# Patient Record
Sex: Male | Born: 1955 | Race: White | Hispanic: No | Marital: Married | Smoking: Former smoker
Health system: Southern US, Community
[De-identification: ages and names within clinical notes are randomized; demographics above are authoritative.]

## PROBLEM LIST (undated history)

## (undated) DIAGNOSIS — R569 Unspecified convulsions: Secondary | ICD-10-CM

## (undated) DIAGNOSIS — F330 Major depressive disorder, recurrent, mild: Secondary | ICD-10-CM

## (undated) DIAGNOSIS — E538 Deficiency of other specified B group vitamins: Secondary | ICD-10-CM

## (undated) DIAGNOSIS — F5101 Primary insomnia: Secondary | ICD-10-CM

## (undated) DIAGNOSIS — E291 Testicular hypofunction: Secondary | ICD-10-CM

## (undated) DIAGNOSIS — Z794 Long term (current) use of insulin: Secondary | ICD-10-CM

## (undated) DIAGNOSIS — N400 Enlarged prostate without lower urinary tract symptoms: Secondary | ICD-10-CM

## (undated) DIAGNOSIS — F1010 Alcohol abuse, uncomplicated: Secondary | ICD-10-CM

## (undated) DIAGNOSIS — E559 Vitamin D deficiency, unspecified: Secondary | ICD-10-CM

## (undated) DIAGNOSIS — I10 Essential (primary) hypertension: Secondary | ICD-10-CM

## (undated) DIAGNOSIS — Z87442 Personal history of urinary calculi: Secondary | ICD-10-CM

## (undated) DIAGNOSIS — R609 Edema, unspecified: Secondary | ICD-10-CM

## (undated) DIAGNOSIS — E782 Mixed hyperlipidemia: Secondary | ICD-10-CM

## (undated) DIAGNOSIS — U071 COVID-19: Secondary | ICD-10-CM

## (undated) DIAGNOSIS — J189 Pneumonia, unspecified organism: Secondary | ICD-10-CM

## (undated) DIAGNOSIS — R52 Pain, unspecified: Secondary | ICD-10-CM

## (undated) DIAGNOSIS — E1142 Type 2 diabetes mellitus with diabetic polyneuropathy: Secondary | ICD-10-CM

## (undated) DIAGNOSIS — F419 Anxiety disorder, unspecified: Secondary | ICD-10-CM

## (undated) DIAGNOSIS — D649 Anemia, unspecified: Secondary | ICD-10-CM

## (undated) DIAGNOSIS — K7460 Unspecified cirrhosis of liver: Secondary | ICD-10-CM

## (undated) DIAGNOSIS — M5116 Intervertebral disc disorders with radiculopathy, lumbar region: Secondary | ICD-10-CM

## (undated) HISTORY — PX: APPENDECTOMY: SHX54

## (undated) HISTORY — DX: Anxiety disorder, unspecified: F41.9

## (undated) HISTORY — DX: Testicular hypofunction: E29.1

## (undated) HISTORY — DX: Type 2 diabetes mellitus with diabetic polyneuropathy: E11.42

## (undated) HISTORY — DX: Pain, unspecified: R52

## (undated) HISTORY — DX: Alcohol abuse, uncomplicated: F10.10

## (undated) HISTORY — DX: Vitamin D deficiency, unspecified: E55.9

## (undated) HISTORY — DX: Deficiency of other specified B group vitamins: E53.8

## (undated) HISTORY — DX: Edema, unspecified: R60.9

## (undated) HISTORY — DX: Intervertebral disc disorders with radiculopathy, lumbar region: M51.16

## (undated) HISTORY — DX: Essential (primary) hypertension: I10

## (undated) HISTORY — DX: Primary insomnia: F51.01

## (undated) HISTORY — DX: Mixed hyperlipidemia: E78.2

## (undated) HISTORY — DX: Benign prostatic hyperplasia without lower urinary tract symptoms: N40.0

## (undated) HISTORY — DX: Major depressive disorder, recurrent, mild: F33.0

## (undated) HISTORY — DX: Unspecified cirrhosis of liver: K74.60

## (undated) HISTORY — PX: BACK SURGERY: SHX140

## (undated) HISTORY — PX: ANKLE FRACTURE SURGERY: SHX122

## (undated) HISTORY — DX: Type 2 diabetes mellitus with diabetic polyneuropathy: Z79.4

---

## 2012-05-07 HISTORY — PX: COLONOSCOPY: SHX174

## 2015-10-14 DIAGNOSIS — E291 Testicular hypofunction: Secondary | ICD-10-CM | POA: Diagnosis not present

## 2015-10-20 DIAGNOSIS — M545 Low back pain: Secondary | ICD-10-CM | POA: Diagnosis not present

## 2015-10-24 DIAGNOSIS — D3131 Benign neoplasm of right choroid: Secondary | ICD-10-CM | POA: Diagnosis not present

## 2015-11-02 DIAGNOSIS — E291 Testicular hypofunction: Secondary | ICD-10-CM | POA: Diagnosis not present

## 2015-11-08 DIAGNOSIS — G5603 Carpal tunnel syndrome, bilateral upper limbs: Secondary | ICD-10-CM | POA: Diagnosis not present

## 2015-11-08 DIAGNOSIS — M545 Low back pain: Secondary | ICD-10-CM | POA: Diagnosis not present

## 2015-11-08 DIAGNOSIS — F419 Anxiety disorder, unspecified: Secondary | ICD-10-CM | POA: Insufficient documentation

## 2015-11-08 DIAGNOSIS — G8929 Other chronic pain: Secondary | ICD-10-CM | POA: Diagnosis not present

## 2015-11-10 DIAGNOSIS — F410 Panic disorder [episodic paroxysmal anxiety] without agoraphobia: Secondary | ICD-10-CM | POA: Diagnosis not present

## 2015-11-10 DIAGNOSIS — E1149 Type 2 diabetes mellitus with other diabetic neurological complication: Secondary | ICD-10-CM | POA: Diagnosis not present

## 2015-11-10 DIAGNOSIS — I10 Essential (primary) hypertension: Secondary | ICD-10-CM | POA: Diagnosis not present

## 2015-11-10 DIAGNOSIS — R5383 Other fatigue: Secondary | ICD-10-CM | POA: Diagnosis not present

## 2015-11-10 DIAGNOSIS — G9341 Metabolic encephalopathy: Secondary | ICD-10-CM | POA: Diagnosis not present

## 2015-11-10 DIAGNOSIS — E291 Testicular hypofunction: Secondary | ICD-10-CM | POA: Diagnosis not present

## 2015-11-10 DIAGNOSIS — N400 Enlarged prostate without lower urinary tract symptoms: Secondary | ICD-10-CM | POA: Diagnosis not present

## 2015-11-10 DIAGNOSIS — E785 Hyperlipidemia, unspecified: Secondary | ICD-10-CM | POA: Diagnosis not present

## 2015-11-10 DIAGNOSIS — Z6832 Body mass index (BMI) 32.0-32.9, adult: Secondary | ICD-10-CM | POA: Diagnosis not present

## 2015-11-10 DIAGNOSIS — G8929 Other chronic pain: Secondary | ICD-10-CM | POA: Diagnosis not present

## 2015-11-15 DIAGNOSIS — E291 Testicular hypofunction: Secondary | ICD-10-CM | POA: Diagnosis not present

## 2015-12-09 DIAGNOSIS — E291 Testicular hypofunction: Secondary | ICD-10-CM | POA: Diagnosis not present

## 2015-12-23 DIAGNOSIS — E291 Testicular hypofunction: Secondary | ICD-10-CM | POA: Diagnosis not present

## 2015-12-27 DIAGNOSIS — J111 Influenza due to unidentified influenza virus with other respiratory manifestations: Secondary | ICD-10-CM | POA: Diagnosis not present

## 2016-01-06 DIAGNOSIS — Z23 Encounter for immunization: Secondary | ICD-10-CM | POA: Diagnosis not present

## 2016-01-06 DIAGNOSIS — E291 Testicular hypofunction: Secondary | ICD-10-CM | POA: Diagnosis not present

## 2016-01-25 DIAGNOSIS — E291 Testicular hypofunction: Secondary | ICD-10-CM | POA: Diagnosis not present

## 2016-02-09 DIAGNOSIS — Z6833 Body mass index (BMI) 33.0-33.9, adult: Secondary | ICD-10-CM | POA: Diagnosis not present

## 2016-02-09 DIAGNOSIS — F410 Panic disorder [episodic paroxysmal anxiety] without agoraphobia: Secondary | ICD-10-CM | POA: Diagnosis not present

## 2016-02-09 DIAGNOSIS — G8929 Other chronic pain: Secondary | ICD-10-CM | POA: Diagnosis not present

## 2016-02-09 DIAGNOSIS — I10 Essential (primary) hypertension: Secondary | ICD-10-CM | POA: Diagnosis not present

## 2016-02-09 DIAGNOSIS — R5383 Other fatigue: Secondary | ICD-10-CM | POA: Diagnosis not present

## 2016-02-09 DIAGNOSIS — N400 Enlarged prostate without lower urinary tract symptoms: Secondary | ICD-10-CM | POA: Diagnosis not present

## 2016-02-09 DIAGNOSIS — E119 Type 2 diabetes mellitus without complications: Secondary | ICD-10-CM | POA: Diagnosis not present

## 2016-02-09 DIAGNOSIS — E291 Testicular hypofunction: Secondary | ICD-10-CM | POA: Diagnosis not present

## 2016-02-09 DIAGNOSIS — E785 Hyperlipidemia, unspecified: Secondary | ICD-10-CM | POA: Diagnosis not present

## 2016-02-09 DIAGNOSIS — E1149 Type 2 diabetes mellitus with other diabetic neurological complication: Secondary | ICD-10-CM | POA: Diagnosis not present

## 2016-02-09 DIAGNOSIS — G9341 Metabolic encephalopathy: Secondary | ICD-10-CM | POA: Diagnosis not present

## 2016-02-28 DIAGNOSIS — M545 Low back pain: Secondary | ICD-10-CM | POA: Diagnosis not present

## 2016-02-28 DIAGNOSIS — E7220 Disorder of urea cycle metabolism, unspecified: Secondary | ICD-10-CM | POA: Diagnosis not present

## 2016-02-28 DIAGNOSIS — R404 Transient alteration of awareness: Secondary | ICD-10-CM | POA: Diagnosis not present

## 2016-02-28 DIAGNOSIS — M5116 Intervertebral disc disorders with radiculopathy, lumbar region: Secondary | ICD-10-CM | POA: Diagnosis not present

## 2016-02-28 DIAGNOSIS — F419 Anxiety disorder, unspecified: Secondary | ICD-10-CM | POA: Diagnosis not present

## 2016-02-28 DIAGNOSIS — G8929 Other chronic pain: Secondary | ICD-10-CM | POA: Diagnosis not present

## 2016-02-28 DIAGNOSIS — F1190 Opioid use, unspecified, uncomplicated: Secondary | ICD-10-CM | POA: Diagnosis not present

## 2016-03-01 DIAGNOSIS — E291 Testicular hypofunction: Secondary | ICD-10-CM | POA: Diagnosis not present

## 2016-03-20 DIAGNOSIS — E291 Testicular hypofunction: Secondary | ICD-10-CM | POA: Diagnosis not present

## 2016-04-19 DIAGNOSIS — E291 Testicular hypofunction: Secondary | ICD-10-CM | POA: Diagnosis not present

## 2016-05-08 DIAGNOSIS — E291 Testicular hypofunction: Secondary | ICD-10-CM | POA: Diagnosis not present

## 2016-06-01 DIAGNOSIS — I10 Essential (primary) hypertension: Secondary | ICD-10-CM | POA: Diagnosis not present

## 2016-06-01 DIAGNOSIS — Z6832 Body mass index (BMI) 32.0-32.9, adult: Secondary | ICD-10-CM | POA: Diagnosis not present

## 2016-06-01 DIAGNOSIS — N400 Enlarged prostate without lower urinary tract symptoms: Secondary | ICD-10-CM | POA: Diagnosis not present

## 2016-06-01 DIAGNOSIS — F410 Panic disorder [episodic paroxysmal anxiety] without agoraphobia: Secondary | ICD-10-CM | POA: Diagnosis not present

## 2016-06-01 DIAGNOSIS — G9341 Metabolic encephalopathy: Secondary | ICD-10-CM | POA: Diagnosis not present

## 2016-06-01 DIAGNOSIS — R5383 Other fatigue: Secondary | ICD-10-CM | POA: Diagnosis not present

## 2016-06-01 DIAGNOSIS — G8929 Other chronic pain: Secondary | ICD-10-CM | POA: Diagnosis not present

## 2016-06-01 DIAGNOSIS — E1149 Type 2 diabetes mellitus with other diabetic neurological complication: Secondary | ICD-10-CM | POA: Diagnosis not present

## 2016-06-01 DIAGNOSIS — E291 Testicular hypofunction: Secondary | ICD-10-CM | POA: Diagnosis not present

## 2016-07-02 DIAGNOSIS — F419 Anxiety disorder, unspecified: Secondary | ICD-10-CM | POA: Diagnosis not present

## 2016-07-02 DIAGNOSIS — M5116 Intervertebral disc disorders with radiculopathy, lumbar region: Secondary | ICD-10-CM | POA: Diagnosis not present

## 2016-07-02 DIAGNOSIS — F1190 Opioid use, unspecified, uncomplicated: Secondary | ICD-10-CM | POA: Diagnosis not present

## 2016-07-02 DIAGNOSIS — Z79899 Other long term (current) drug therapy: Secondary | ICD-10-CM | POA: Diagnosis not present

## 2016-07-21 DIAGNOSIS — Z23 Encounter for immunization: Secondary | ICD-10-CM | POA: Diagnosis not present

## 2016-07-21 DIAGNOSIS — E291 Testicular hypofunction: Secondary | ICD-10-CM | POA: Diagnosis not present

## 2016-08-03 DIAGNOSIS — E291 Testicular hypofunction: Secondary | ICD-10-CM | POA: Diagnosis not present

## 2016-08-17 DIAGNOSIS — E291 Testicular hypofunction: Secondary | ICD-10-CM | POA: Diagnosis not present

## 2016-08-31 DIAGNOSIS — E291 Testicular hypofunction: Secondary | ICD-10-CM | POA: Diagnosis not present

## 2016-09-21 DIAGNOSIS — E291 Testicular hypofunction: Secondary | ICD-10-CM | POA: Diagnosis not present

## 2016-09-26 DIAGNOSIS — R600 Localized edema: Secondary | ICD-10-CM | POA: Diagnosis not present

## 2016-09-26 DIAGNOSIS — R609 Edema, unspecified: Secondary | ICD-10-CM | POA: Diagnosis not present

## 2016-10-23 DIAGNOSIS — I10 Essential (primary) hypertension: Secondary | ICD-10-CM | POA: Diagnosis not present

## 2016-10-23 DIAGNOSIS — F410 Panic disorder [episodic paroxysmal anxiety] without agoraphobia: Secondary | ICD-10-CM | POA: Diagnosis not present

## 2016-10-23 DIAGNOSIS — N400 Enlarged prostate without lower urinary tract symptoms: Secondary | ICD-10-CM | POA: Diagnosis not present

## 2016-10-23 DIAGNOSIS — E1149 Type 2 diabetes mellitus with other diabetic neurological complication: Secondary | ICD-10-CM | POA: Diagnosis not present

## 2016-10-23 DIAGNOSIS — E785 Hyperlipidemia, unspecified: Secondary | ICD-10-CM | POA: Diagnosis not present

## 2016-10-23 DIAGNOSIS — Z6832 Body mass index (BMI) 32.0-32.9, adult: Secondary | ICD-10-CM | POA: Diagnosis not present

## 2016-10-23 DIAGNOSIS — E291 Testicular hypofunction: Secondary | ICD-10-CM | POA: Diagnosis not present

## 2016-10-23 DIAGNOSIS — G9341 Metabolic encephalopathy: Secondary | ICD-10-CM | POA: Diagnosis not present

## 2016-10-23 DIAGNOSIS — G8929 Other chronic pain: Secondary | ICD-10-CM | POA: Diagnosis not present

## 2016-10-27 DIAGNOSIS — E291 Testicular hypofunction: Secondary | ICD-10-CM | POA: Diagnosis not present

## 2016-11-05 DIAGNOSIS — G8929 Other chronic pain: Secondary | ICD-10-CM | POA: Diagnosis not present

## 2016-11-05 DIAGNOSIS — M545 Low back pain: Secondary | ICD-10-CM | POA: Diagnosis not present

## 2016-11-05 DIAGNOSIS — Z79899 Other long term (current) drug therapy: Secondary | ICD-10-CM | POA: Diagnosis not present

## 2016-11-05 DIAGNOSIS — F1190 Opioid use, unspecified, uncomplicated: Secondary | ICD-10-CM | POA: Diagnosis not present

## 2016-11-05 DIAGNOSIS — M5116 Intervertebral disc disorders with radiculopathy, lumbar region: Secondary | ICD-10-CM | POA: Diagnosis not present

## 2016-11-10 DIAGNOSIS — E291 Testicular hypofunction: Secondary | ICD-10-CM | POA: Diagnosis not present

## 2016-12-01 DIAGNOSIS — E291 Testicular hypofunction: Secondary | ICD-10-CM | POA: Diagnosis not present

## 2016-12-06 DIAGNOSIS — D3131 Benign neoplasm of right choroid: Secondary | ICD-10-CM | POA: Diagnosis not present

## 2016-12-10 DIAGNOSIS — G9341 Metabolic encephalopathy: Secondary | ICD-10-CM | POA: Diagnosis not present

## 2016-12-10 DIAGNOSIS — R413 Other amnesia: Secondary | ICD-10-CM | POA: Diagnosis not present

## 2016-12-10 DIAGNOSIS — R609 Edema, unspecified: Secondary | ICD-10-CM | POA: Diagnosis not present

## 2016-12-10 DIAGNOSIS — G8929 Other chronic pain: Secondary | ICD-10-CM | POA: Diagnosis not present

## 2016-12-27 DIAGNOSIS — E291 Testicular hypofunction: Secondary | ICD-10-CM | POA: Diagnosis not present

## 2017-01-10 DIAGNOSIS — E291 Testicular hypofunction: Secondary | ICD-10-CM | POA: Diagnosis not present

## 2017-01-29 DIAGNOSIS — E291 Testicular hypofunction: Secondary | ICD-10-CM | POA: Diagnosis not present

## 2017-02-14 DIAGNOSIS — E291 Testicular hypofunction: Secondary | ICD-10-CM | POA: Diagnosis not present

## 2017-02-20 DIAGNOSIS — N400 Enlarged prostate without lower urinary tract symptoms: Secondary | ICD-10-CM | POA: Diagnosis not present

## 2017-02-20 DIAGNOSIS — G9341 Metabolic encephalopathy: Secondary | ICD-10-CM | POA: Diagnosis not present

## 2017-02-20 DIAGNOSIS — G8929 Other chronic pain: Secondary | ICD-10-CM | POA: Diagnosis not present

## 2017-02-20 DIAGNOSIS — E785 Hyperlipidemia, unspecified: Secondary | ICD-10-CM | POA: Diagnosis not present

## 2017-02-20 DIAGNOSIS — F410 Panic disorder [episodic paroxysmal anxiety] without agoraphobia: Secondary | ICD-10-CM | POA: Diagnosis not present

## 2017-02-20 DIAGNOSIS — I10 Essential (primary) hypertension: Secondary | ICD-10-CM | POA: Diagnosis not present

## 2017-02-20 DIAGNOSIS — R5383 Other fatigue: Secondary | ICD-10-CM | POA: Diagnosis not present

## 2017-02-20 DIAGNOSIS — E1149 Type 2 diabetes mellitus with other diabetic neurological complication: Secondary | ICD-10-CM | POA: Diagnosis not present

## 2017-03-02 DIAGNOSIS — E291 Testicular hypofunction: Secondary | ICD-10-CM | POA: Diagnosis not present

## 2017-03-05 DIAGNOSIS — G8929 Other chronic pain: Secondary | ICD-10-CM | POA: Diagnosis not present

## 2017-03-05 DIAGNOSIS — Z79899 Other long term (current) drug therapy: Secondary | ICD-10-CM | POA: Diagnosis not present

## 2017-03-05 DIAGNOSIS — M5116 Intervertebral disc disorders with radiculopathy, lumbar region: Secondary | ICD-10-CM | POA: Diagnosis not present

## 2017-03-05 DIAGNOSIS — M545 Low back pain: Secondary | ICD-10-CM | POA: Diagnosis not present

## 2017-03-12 DIAGNOSIS — M79661 Pain in right lower leg: Secondary | ICD-10-CM | POA: Diagnosis not present

## 2017-03-12 DIAGNOSIS — R2241 Localized swelling, mass and lump, right lower limb: Secondary | ICD-10-CM | POA: Diagnosis not present

## 2017-03-12 DIAGNOSIS — R600 Localized edema: Secondary | ICD-10-CM | POA: Diagnosis not present

## 2017-03-26 ENCOUNTER — Other Ambulatory Visit: Payer: Self-pay | Admitting: Nurse Practitioner

## 2017-03-26 DIAGNOSIS — IMO0002 Reserved for concepts with insufficient information to code with codable children: Secondary | ICD-10-CM

## 2017-03-26 DIAGNOSIS — R229 Localized swelling, mass and lump, unspecified: Principal | ICD-10-CM

## 2017-03-28 DIAGNOSIS — E291 Testicular hypofunction: Secondary | ICD-10-CM | POA: Diagnosis not present

## 2017-04-07 ENCOUNTER — Ambulatory Visit
Admission: RE | Admit: 2017-04-07 | Discharge: 2017-04-07 | Disposition: A | Discharge status: Home / Self Care | Payer: Self-pay | Source: Ambulatory Visit | Attending: Nurse Practitioner | Admitting: Nurse Practitioner

## 2017-04-07 DIAGNOSIS — IMO0002 Reserved for concepts with insufficient information to code with codable children: Secondary | ICD-10-CM

## 2017-04-07 DIAGNOSIS — R229 Localized swelling, mass and lump, unspecified: Principal | ICD-10-CM

## 2017-04-08 ENCOUNTER — Other Ambulatory Visit: Payer: Self-pay | Admitting: Nurse Practitioner

## 2017-04-08 DIAGNOSIS — R229 Localized swelling, mass and lump, unspecified: Principal | ICD-10-CM

## 2017-04-08 DIAGNOSIS — IMO0002 Reserved for concepts with insufficient information to code with codable children: Secondary | ICD-10-CM

## 2017-04-16 ENCOUNTER — Ambulatory Visit
Admission: RE | Admit: 2017-04-16 | Discharge: 2017-04-16 | Disposition: A | Discharge status: Home / Self Care | Payer: PPO | Source: Ambulatory Visit | Attending: Nurse Practitioner | Admitting: Nurse Practitioner

## 2017-04-16 DIAGNOSIS — IMO0002 Reserved for concepts with insufficient information to code with codable children: Secondary | ICD-10-CM

## 2017-04-16 DIAGNOSIS — R229 Localized swelling, mass and lump, unspecified: Principal | ICD-10-CM

## 2017-04-16 DIAGNOSIS — M7989 Other specified soft tissue disorders: Secondary | ICD-10-CM | POA: Diagnosis not present

## 2017-04-16 MED ORDER — GADOBENATE DIMEGLUMINE 529 MG/ML IV SOLN
20.0000 mL | Freq: Once | INTRAVENOUS | Status: AC | PRN
  Administered 2017-04-16: 20 mL via INTRAVENOUS

## 2017-04-24 DIAGNOSIS — I872 Venous insufficiency (chronic) (peripheral): Secondary | ICD-10-CM | POA: Diagnosis not present

## 2017-04-24 DIAGNOSIS — I83891 Varicose veins of right lower extremities with other complications: Secondary | ICD-10-CM | POA: Diagnosis not present

## 2017-04-24 DIAGNOSIS — I890 Lymphedema, not elsewhere classified: Secondary | ICD-10-CM | POA: Diagnosis not present

## 2017-04-24 DIAGNOSIS — M7981 Nontraumatic hematoma of soft tissue: Secondary | ICD-10-CM | POA: Diagnosis not present

## 2017-05-16 DIAGNOSIS — E291 Testicular hypofunction: Secondary | ICD-10-CM | POA: Diagnosis not present

## 2017-05-27 DIAGNOSIS — F419 Anxiety disorder, unspecified: Secondary | ICD-10-CM | POA: Diagnosis not present

## 2017-05-27 DIAGNOSIS — F1190 Opioid use, unspecified, uncomplicated: Secondary | ICD-10-CM | POA: Diagnosis not present

## 2017-05-27 DIAGNOSIS — M545 Low back pain: Secondary | ICD-10-CM | POA: Diagnosis not present

## 2017-05-27 DIAGNOSIS — M5116 Intervertebral disc disorders with radiculopathy, lumbar region: Secondary | ICD-10-CM | POA: Diagnosis not present

## 2017-06-20 DIAGNOSIS — Z0001 Encounter for general adult medical examination with abnormal findings: Secondary | ICD-10-CM | POA: Diagnosis not present

## 2017-06-20 DIAGNOSIS — E291 Testicular hypofunction: Secondary | ICD-10-CM | POA: Diagnosis not present

## 2017-06-20 DIAGNOSIS — Z6832 Body mass index (BMI) 32.0-32.9, adult: Secondary | ICD-10-CM | POA: Diagnosis not present

## 2017-06-20 DIAGNOSIS — N400 Enlarged prostate without lower urinary tract symptoms: Secondary | ICD-10-CM | POA: Diagnosis not present

## 2017-06-20 DIAGNOSIS — G9341 Metabolic encephalopathy: Secondary | ICD-10-CM | POA: Diagnosis not present

## 2017-06-20 DIAGNOSIS — F410 Panic disorder [episodic paroxysmal anxiety] without agoraphobia: Secondary | ICD-10-CM | POA: Diagnosis not present

## 2017-06-20 DIAGNOSIS — E785 Hyperlipidemia, unspecified: Secondary | ICD-10-CM | POA: Diagnosis not present

## 2017-06-20 DIAGNOSIS — Z23 Encounter for immunization: Secondary | ICD-10-CM | POA: Diagnosis not present

## 2017-06-20 DIAGNOSIS — I10 Essential (primary) hypertension: Secondary | ICD-10-CM | POA: Diagnosis not present

## 2017-06-20 DIAGNOSIS — R2241 Localized swelling, mass and lump, right lower limb: Secondary | ICD-10-CM | POA: Diagnosis not present

## 2017-06-20 DIAGNOSIS — G8929 Other chronic pain: Secondary | ICD-10-CM | POA: Diagnosis not present

## 2017-06-20 DIAGNOSIS — E1149 Type 2 diabetes mellitus with other diabetic neurological complication: Secondary | ICD-10-CM | POA: Diagnosis not present

## 2017-06-20 DIAGNOSIS — R5383 Other fatigue: Secondary | ICD-10-CM | POA: Diagnosis not present

## 2017-06-27 DIAGNOSIS — E291 Testicular hypofunction: Secondary | ICD-10-CM | POA: Diagnosis not present

## 2017-07-10 DIAGNOSIS — M5116 Intervertebral disc disorders with radiculopathy, lumbar region: Secondary | ICD-10-CM | POA: Diagnosis not present

## 2017-07-13 DIAGNOSIS — E291 Testicular hypofunction: Secondary | ICD-10-CM | POA: Diagnosis not present

## 2017-07-22 DIAGNOSIS — M79605 Pain in left leg: Secondary | ICD-10-CM | POA: Diagnosis not present

## 2017-07-22 DIAGNOSIS — Z01818 Encounter for other preprocedural examination: Secondary | ICD-10-CM | POA: Diagnosis not present

## 2017-07-22 DIAGNOSIS — S82842A Displaced bimalleolar fracture of left lower leg, initial encounter for closed fracture: Secondary | ICD-10-CM | POA: Diagnosis not present

## 2017-07-22 DIAGNOSIS — J9811 Atelectasis: Secondary | ICD-10-CM | POA: Diagnosis not present

## 2017-07-22 DIAGNOSIS — S82402A Unspecified fracture of shaft of left fibula, initial encounter for closed fracture: Secondary | ICD-10-CM | POA: Diagnosis not present

## 2017-07-30 DIAGNOSIS — I2119 ST elevation (STEMI) myocardial infarction involving other coronary artery of inferior wall: Secondary | ICD-10-CM | POA: Diagnosis not present

## 2017-07-30 DIAGNOSIS — Z01818 Encounter for other preprocedural examination: Secondary | ICD-10-CM | POA: Diagnosis not present

## 2017-07-30 DIAGNOSIS — E1142 Type 2 diabetes mellitus with diabetic polyneuropathy: Secondary | ICD-10-CM | POA: Insufficient documentation

## 2017-07-30 DIAGNOSIS — R9431 Abnormal electrocardiogram [ECG] [EKG]: Secondary | ICD-10-CM | POA: Insufficient documentation

## 2017-07-30 DIAGNOSIS — E1140 Type 2 diabetes mellitus with diabetic neuropathy, unspecified: Secondary | ICD-10-CM | POA: Diagnosis not present

## 2017-07-30 DIAGNOSIS — Z01810 Encounter for preprocedural cardiovascular examination: Secondary | ICD-10-CM | POA: Diagnosis not present

## 2017-07-31 DIAGNOSIS — E669 Obesity, unspecified: Secondary | ICD-10-CM | POA: Diagnosis not present

## 2017-07-31 DIAGNOSIS — I10 Essential (primary) hypertension: Secondary | ICD-10-CM | POA: Diagnosis not present

## 2017-07-31 DIAGNOSIS — Z7984 Long term (current) use of oral hypoglycemic drugs: Secondary | ICD-10-CM | POA: Diagnosis not present

## 2017-07-31 DIAGNOSIS — E119 Type 2 diabetes mellitus without complications: Secondary | ICD-10-CM | POA: Diagnosis not present

## 2017-07-31 DIAGNOSIS — F17210 Nicotine dependence, cigarettes, uncomplicated: Secondary | ICD-10-CM | POA: Diagnosis not present

## 2017-07-31 DIAGNOSIS — Z87891 Personal history of nicotine dependence: Secondary | ICD-10-CM | POA: Diagnosis not present

## 2017-07-31 DIAGNOSIS — G8918 Other acute postprocedural pain: Secondary | ICD-10-CM | POA: Diagnosis not present

## 2017-07-31 DIAGNOSIS — Z6832 Body mass index (BMI) 32.0-32.9, adult: Secondary | ICD-10-CM | POA: Diagnosis not present

## 2017-07-31 DIAGNOSIS — F419 Anxiety disorder, unspecified: Secondary | ICD-10-CM | POA: Diagnosis not present

## 2017-07-31 DIAGNOSIS — F17200 Nicotine dependence, unspecified, uncomplicated: Secondary | ICD-10-CM | POA: Diagnosis not present

## 2017-07-31 DIAGNOSIS — S82842A Displaced bimalleolar fracture of left lower leg, initial encounter for closed fracture: Secondary | ICD-10-CM | POA: Diagnosis not present

## 2017-07-31 DIAGNOSIS — E7800 Pure hypercholesterolemia, unspecified: Secondary | ICD-10-CM | POA: Diagnosis not present

## 2017-07-31 DIAGNOSIS — E291 Testicular hypofunction: Secondary | ICD-10-CM | POA: Diagnosis not present

## 2017-07-31 DIAGNOSIS — Z79899 Other long term (current) drug therapy: Secondary | ICD-10-CM | POA: Diagnosis not present

## 2017-07-31 DIAGNOSIS — F329 Major depressive disorder, single episode, unspecified: Secondary | ICD-10-CM | POA: Diagnosis not present

## 2017-08-13 DIAGNOSIS — S82842A Displaced bimalleolar fracture of left lower leg, initial encounter for closed fracture: Secondary | ICD-10-CM | POA: Diagnosis not present

## 2017-08-14 DIAGNOSIS — S82842A Displaced bimalleolar fracture of left lower leg, initial encounter for closed fracture: Secondary | ICD-10-CM | POA: Diagnosis not present

## 2017-08-16 DIAGNOSIS — E291 Testicular hypofunction: Secondary | ICD-10-CM | POA: Diagnosis not present

## 2017-09-03 DIAGNOSIS — Z79899 Other long term (current) drug therapy: Secondary | ICD-10-CM | POA: Diagnosis not present

## 2017-09-03 DIAGNOSIS — M5116 Intervertebral disc disorders with radiculopathy, lumbar region: Secondary | ICD-10-CM | POA: Diagnosis not present

## 2017-09-10 DIAGNOSIS — S82842A Displaced bimalleolar fracture of left lower leg, initial encounter for closed fracture: Secondary | ICD-10-CM | POA: Diagnosis not present

## 2017-09-18 DIAGNOSIS — E291 Testicular hypofunction: Secondary | ICD-10-CM | POA: Diagnosis not present

## 2017-09-18 DIAGNOSIS — R109 Unspecified abdominal pain: Secondary | ICD-10-CM | POA: Diagnosis not present

## 2017-09-18 DIAGNOSIS — R197 Diarrhea, unspecified: Secondary | ICD-10-CM | POA: Diagnosis not present

## 2017-09-19 DIAGNOSIS — R197 Diarrhea, unspecified: Secondary | ICD-10-CM | POA: Diagnosis not present

## 2017-10-02 DIAGNOSIS — E291 Testicular hypofunction: Secondary | ICD-10-CM | POA: Diagnosis not present

## 2017-10-17 DIAGNOSIS — J019 Acute sinusitis, unspecified: Secondary | ICD-10-CM | POA: Diagnosis not present

## 2017-10-17 DIAGNOSIS — E291 Testicular hypofunction: Secondary | ICD-10-CM | POA: Diagnosis not present

## 2017-10-24 DIAGNOSIS — S82842A Displaced bimalleolar fracture of left lower leg, initial encounter for closed fracture: Secondary | ICD-10-CM | POA: Diagnosis not present

## 2017-11-01 DIAGNOSIS — E291 Testicular hypofunction: Secondary | ICD-10-CM | POA: Diagnosis not present

## 2017-11-06 DIAGNOSIS — F1190 Opioid use, unspecified, uncomplicated: Secondary | ICD-10-CM | POA: Diagnosis not present

## 2017-11-06 DIAGNOSIS — M5116 Intervertebral disc disorders with radiculopathy, lumbar region: Secondary | ICD-10-CM | POA: Diagnosis not present

## 2017-11-06 DIAGNOSIS — Z79899 Other long term (current) drug therapy: Secondary | ICD-10-CM | POA: Diagnosis not present

## 2017-11-08 DIAGNOSIS — S82842A Displaced bimalleolar fracture of left lower leg, initial encounter for closed fracture: Secondary | ICD-10-CM | POA: Diagnosis not present

## 2017-12-09 DIAGNOSIS — H5203 Hypermetropia, bilateral: Secondary | ICD-10-CM | POA: Diagnosis not present

## 2017-12-09 DIAGNOSIS — H2513 Age-related nuclear cataract, bilateral: Secondary | ICD-10-CM | POA: Diagnosis not present

## 2017-12-09 DIAGNOSIS — E119 Type 2 diabetes mellitus without complications: Secondary | ICD-10-CM | POA: Diagnosis not present

## 2017-12-09 DIAGNOSIS — H50331 Intermittent monocular exotropia, right eye: Secondary | ICD-10-CM | POA: Diagnosis not present

## 2017-12-12 DIAGNOSIS — E291 Testicular hypofunction: Secondary | ICD-10-CM | POA: Diagnosis not present

## 2017-12-12 DIAGNOSIS — I10 Essential (primary) hypertension: Secondary | ICD-10-CM | POA: Diagnosis not present

## 2017-12-12 DIAGNOSIS — E119 Type 2 diabetes mellitus without complications: Secondary | ICD-10-CM | POA: Diagnosis not present

## 2017-12-12 DIAGNOSIS — E785 Hyperlipidemia, unspecified: Secondary | ICD-10-CM | POA: Diagnosis not present

## 2017-12-12 DIAGNOSIS — F419 Anxiety disorder, unspecified: Secondary | ICD-10-CM | POA: Diagnosis not present

## 2018-01-07 DIAGNOSIS — F5101 Primary insomnia: Secondary | ICD-10-CM | POA: Diagnosis not present

## 2018-01-07 DIAGNOSIS — R5381 Other malaise: Secondary | ICD-10-CM | POA: Diagnosis not present

## 2018-01-07 DIAGNOSIS — N400 Enlarged prostate without lower urinary tract symptoms: Secondary | ICD-10-CM | POA: Insufficient documentation

## 2018-01-07 DIAGNOSIS — M5116 Intervertebral disc disorders with radiculopathy, lumbar region: Secondary | ICD-10-CM | POA: Diagnosis not present

## 2018-01-07 DIAGNOSIS — E1140 Type 2 diabetes mellitus with diabetic neuropathy, unspecified: Secondary | ICD-10-CM | POA: Diagnosis not present

## 2018-01-07 DIAGNOSIS — R5383 Other fatigue: Secondary | ICD-10-CM | POA: Diagnosis not present

## 2018-01-07 DIAGNOSIS — E782 Mixed hyperlipidemia: Secondary | ICD-10-CM | POA: Insufficient documentation

## 2018-01-07 DIAGNOSIS — I10 Essential (primary) hypertension: Secondary | ICD-10-CM | POA: Insufficient documentation

## 2018-01-07 DIAGNOSIS — E538 Deficiency of other specified B group vitamins: Secondary | ICD-10-CM | POA: Diagnosis not present

## 2018-01-07 DIAGNOSIS — F419 Anxiety disorder, unspecified: Secondary | ICD-10-CM | POA: Diagnosis not present

## 2018-01-07 DIAGNOSIS — Z79899 Other long term (current) drug therapy: Secondary | ICD-10-CM | POA: Diagnosis not present

## 2018-01-24 DIAGNOSIS — R51 Headache: Secondary | ICD-10-CM | POA: Diagnosis not present

## 2018-01-24 DIAGNOSIS — I6782 Cerebral ischemia: Secondary | ICD-10-CM | POA: Diagnosis not present

## 2018-02-05 DIAGNOSIS — F410 Panic disorder [episodic paroxysmal anxiety] without agoraphobia: Secondary | ICD-10-CM | POA: Diagnosis not present

## 2018-02-05 DIAGNOSIS — G9349 Other encephalopathy: Secondary | ICD-10-CM | POA: Diagnosis not present

## 2018-02-05 DIAGNOSIS — Z79899 Other long term (current) drug therapy: Secondary | ICD-10-CM | POA: Diagnosis not present

## 2018-02-05 DIAGNOSIS — M5116 Intervertebral disc disorders with radiculopathy, lumbar region: Secondary | ICD-10-CM | POA: Diagnosis not present

## 2018-02-19 DIAGNOSIS — R7989 Other specified abnormal findings of blood chemistry: Secondary | ICD-10-CM | POA: Diagnosis not present

## 2018-03-06 DIAGNOSIS — R7989 Other specified abnormal findings of blood chemistry: Secondary | ICD-10-CM | POA: Diagnosis not present

## 2018-03-21 DIAGNOSIS — R7989 Other specified abnormal findings of blood chemistry: Secondary | ICD-10-CM | POA: Diagnosis not present

## 2018-04-08 DIAGNOSIS — R7989 Other specified abnormal findings of blood chemistry: Secondary | ICD-10-CM | POA: Diagnosis not present

## 2018-04-09 DIAGNOSIS — M5116 Intervertebral disc disorders with radiculopathy, lumbar region: Secondary | ICD-10-CM | POA: Diagnosis not present

## 2018-04-23 DIAGNOSIS — M5116 Intervertebral disc disorders with radiculopathy, lumbar region: Secondary | ICD-10-CM | POA: Diagnosis not present

## 2018-04-25 DIAGNOSIS — R7989 Other specified abnormal findings of blood chemistry: Secondary | ICD-10-CM | POA: Diagnosis not present

## 2018-05-16 DIAGNOSIS — R7989 Other specified abnormal findings of blood chemistry: Secondary | ICD-10-CM | POA: Diagnosis not present

## 2018-05-22 DIAGNOSIS — M5116 Intervertebral disc disorders with radiculopathy, lumbar region: Secondary | ICD-10-CM | POA: Diagnosis not present

## 2018-05-22 DIAGNOSIS — F1190 Opioid use, unspecified, uncomplicated: Secondary | ICD-10-CM | POA: Diagnosis not present

## 2018-05-22 DIAGNOSIS — Z79899 Other long term (current) drug therapy: Secondary | ICD-10-CM | POA: Diagnosis not present

## 2018-06-09 DIAGNOSIS — R7989 Other specified abnormal findings of blood chemistry: Secondary | ICD-10-CM | POA: Diagnosis not present

## 2018-06-23 DIAGNOSIS — Z23 Encounter for immunization: Secondary | ICD-10-CM | POA: Diagnosis not present

## 2018-07-07 DIAGNOSIS — R7989 Other specified abnormal findings of blood chemistry: Secondary | ICD-10-CM | POA: Diagnosis not present

## 2018-07-22 DIAGNOSIS — R7989 Other specified abnormal findings of blood chemistry: Secondary | ICD-10-CM | POA: Diagnosis not present

## 2018-08-08 DIAGNOSIS — R7989 Other specified abnormal findings of blood chemistry: Secondary | ICD-10-CM | POA: Diagnosis not present

## 2018-08-26 DIAGNOSIS — R7989 Other specified abnormal findings of blood chemistry: Secondary | ICD-10-CM | POA: Diagnosis not present

## 2018-09-10 DIAGNOSIS — F5101 Primary insomnia: Secondary | ICD-10-CM | POA: Diagnosis not present

## 2018-09-10 DIAGNOSIS — F419 Anxiety disorder, unspecified: Secondary | ICD-10-CM | POA: Diagnosis not present

## 2018-09-10 DIAGNOSIS — E1142 Type 2 diabetes mellitus with diabetic polyneuropathy: Secondary | ICD-10-CM | POA: Diagnosis not present

## 2018-09-10 DIAGNOSIS — E782 Mixed hyperlipidemia: Secondary | ICD-10-CM | POA: Diagnosis not present

## 2018-09-10 DIAGNOSIS — E538 Deficiency of other specified B group vitamins: Secondary | ICD-10-CM | POA: Diagnosis not present

## 2018-09-10 DIAGNOSIS — N400 Enlarged prostate without lower urinary tract symptoms: Secondary | ICD-10-CM | POA: Diagnosis not present

## 2018-09-10 DIAGNOSIS — E119 Type 2 diabetes mellitus without complications: Secondary | ICD-10-CM | POA: Diagnosis not present

## 2018-09-10 DIAGNOSIS — E291 Testicular hypofunction: Secondary | ICD-10-CM | POA: Diagnosis not present

## 2018-09-10 DIAGNOSIS — Z79899 Other long term (current) drug therapy: Secondary | ICD-10-CM | POA: Diagnosis not present

## 2018-09-10 DIAGNOSIS — I10 Essential (primary) hypertension: Secondary | ICD-10-CM | POA: Diagnosis not present

## 2018-09-10 DIAGNOSIS — Z794 Long term (current) use of insulin: Secondary | ICD-10-CM | POA: Diagnosis not present

## 2018-09-17 DIAGNOSIS — Z87891 Personal history of nicotine dependence: Secondary | ICD-10-CM | POA: Diagnosis not present

## 2018-09-17 DIAGNOSIS — J3489 Other specified disorders of nose and nasal sinuses: Secondary | ICD-10-CM | POA: Diagnosis not present

## 2018-09-17 DIAGNOSIS — R05 Cough: Secondary | ICD-10-CM | POA: Diagnosis not present

## 2018-09-17 DIAGNOSIS — J209 Acute bronchitis, unspecified: Secondary | ICD-10-CM | POA: Diagnosis not present

## 2018-10-15 DIAGNOSIS — E291 Testicular hypofunction: Secondary | ICD-10-CM | POA: Diagnosis not present

## 2018-11-17 DIAGNOSIS — S80811A Abrasion, right lower leg, initial encounter: Secondary | ICD-10-CM | POA: Diagnosis not present

## 2018-11-17 DIAGNOSIS — L602 Onychogryphosis: Secondary | ICD-10-CM | POA: Diagnosis not present

## 2018-11-17 DIAGNOSIS — Z794 Long term (current) use of insulin: Secondary | ICD-10-CM | POA: Diagnosis not present

## 2018-11-17 DIAGNOSIS — E1142 Type 2 diabetes mellitus with diabetic polyneuropathy: Secondary | ICD-10-CM | POA: Diagnosis not present

## 2019-01-19 DIAGNOSIS — E291 Testicular hypofunction: Secondary | ICD-10-CM | POA: Diagnosis not present

## 2019-01-29 DIAGNOSIS — Z794 Long term (current) use of insulin: Secondary | ICD-10-CM | POA: Diagnosis not present

## 2019-01-29 DIAGNOSIS — I10 Essential (primary) hypertension: Secondary | ICD-10-CM | POA: Diagnosis not present

## 2019-01-29 DIAGNOSIS — M792 Neuralgia and neuritis, unspecified: Secondary | ICD-10-CM | POA: Diagnosis not present

## 2019-01-29 DIAGNOSIS — Z79899 Other long term (current) drug therapy: Secondary | ICD-10-CM | POA: Diagnosis not present

## 2019-01-29 DIAGNOSIS — M5090 Cervical disc disorder, unspecified, unspecified cervical region: Secondary | ICD-10-CM | POA: Diagnosis not present

## 2019-01-29 DIAGNOSIS — E1142 Type 2 diabetes mellitus with diabetic polyneuropathy: Secondary | ICD-10-CM | POA: Diagnosis not present

## 2019-02-04 DIAGNOSIS — E291 Testicular hypofunction: Secondary | ICD-10-CM | POA: Diagnosis not present

## 2019-02-19 DIAGNOSIS — E291 Testicular hypofunction: Secondary | ICD-10-CM | POA: Diagnosis not present

## 2019-03-05 DIAGNOSIS — E291 Testicular hypofunction: Secondary | ICD-10-CM | POA: Diagnosis not present

## 2019-03-20 DIAGNOSIS — Z79899 Other long term (current) drug therapy: Secondary | ICD-10-CM | POA: Diagnosis not present

## 2019-03-20 DIAGNOSIS — E1142 Type 2 diabetes mellitus with diabetic polyneuropathy: Secondary | ICD-10-CM | POA: Diagnosis not present

## 2019-03-20 DIAGNOSIS — F410 Panic disorder [episodic paroxysmal anxiety] without agoraphobia: Secondary | ICD-10-CM | POA: Diagnosis not present

## 2019-03-20 DIAGNOSIS — Z794 Long term (current) use of insulin: Secondary | ICD-10-CM | POA: Diagnosis not present

## 2019-03-20 DIAGNOSIS — N400 Enlarged prostate without lower urinary tract symptoms: Secondary | ICD-10-CM | POA: Diagnosis not present

## 2019-03-20 DIAGNOSIS — M5116 Intervertebral disc disorders with radiculopathy, lumbar region: Secondary | ICD-10-CM | POA: Diagnosis not present

## 2019-03-20 DIAGNOSIS — F419 Anxiety disorder, unspecified: Secondary | ICD-10-CM | POA: Diagnosis not present

## 2019-03-20 DIAGNOSIS — E538 Deficiency of other specified B group vitamins: Secondary | ICD-10-CM | POA: Diagnosis not present

## 2019-03-20 DIAGNOSIS — E291 Testicular hypofunction: Secondary | ICD-10-CM | POA: Diagnosis not present

## 2019-03-20 DIAGNOSIS — E782 Mixed hyperlipidemia: Secondary | ICD-10-CM | POA: Diagnosis not present

## 2019-03-20 DIAGNOSIS — I10 Essential (primary) hypertension: Secondary | ICD-10-CM | POA: Diagnosis not present

## 2019-03-20 DIAGNOSIS — Z23 Encounter for immunization: Secondary | ICD-10-CM | POA: Diagnosis not present

## 2019-03-20 DIAGNOSIS — F330 Major depressive disorder, recurrent, mild: Secondary | ICD-10-CM | POA: Diagnosis not present

## 2019-03-20 DIAGNOSIS — Z0000 Encounter for general adult medical examination without abnormal findings: Secondary | ICD-10-CM | POA: Diagnosis not present

## 2019-03-20 DIAGNOSIS — F5101 Primary insomnia: Secondary | ICD-10-CM | POA: Diagnosis not present

## 2019-03-27 DIAGNOSIS — G40909 Epilepsy, unspecified, not intractable, without status epilepticus: Secondary | ICD-10-CM | POA: Diagnosis not present

## 2019-03-27 DIAGNOSIS — R413 Other amnesia: Secondary | ICD-10-CM | POA: Diagnosis not present

## 2019-03-27 DIAGNOSIS — Z79899 Other long term (current) drug therapy: Secondary | ICD-10-CM | POA: Diagnosis not present

## 2019-03-27 DIAGNOSIS — G4700 Insomnia, unspecified: Secondary | ICD-10-CM | POA: Diagnosis not present

## 2019-03-27 DIAGNOSIS — G5603 Carpal tunnel syndrome, bilateral upper limbs: Secondary | ICD-10-CM | POA: Diagnosis not present

## 2019-03-27 DIAGNOSIS — F1190 Opioid use, unspecified, uncomplicated: Secondary | ICD-10-CM | POA: Diagnosis not present

## 2019-03-27 DIAGNOSIS — F419 Anxiety disorder, unspecified: Secondary | ICD-10-CM | POA: Diagnosis not present

## 2019-03-27 DIAGNOSIS — R51 Headache: Secondary | ICD-10-CM | POA: Diagnosis not present

## 2019-03-27 DIAGNOSIS — F410 Panic disorder [episodic paroxysmal anxiety] without agoraphobia: Secondary | ICD-10-CM | POA: Diagnosis not present

## 2019-03-27 DIAGNOSIS — M5116 Intervertebral disc disorders with radiculopathy, lumbar region: Secondary | ICD-10-CM | POA: Diagnosis not present

## 2019-03-27 DIAGNOSIS — R269 Unspecified abnormalities of gait and mobility: Secondary | ICD-10-CM | POA: Diagnosis not present

## 2019-03-30 ENCOUNTER — Encounter: Payer: Self-pay | Admitting: Gastroenterology

## 2019-04-02 DIAGNOSIS — E291 Testicular hypofunction: Secondary | ICD-10-CM | POA: Diagnosis not present

## 2019-04-10 ENCOUNTER — Encounter: Payer: Self-pay | Admitting: Gastroenterology

## 2019-04-10 ENCOUNTER — Telehealth (INDEPENDENT_AMBULATORY_CARE_PROVIDER_SITE_OTHER): Payer: PPO | Admitting: Gastroenterology

## 2019-04-10 ENCOUNTER — Other Ambulatory Visit: Payer: Self-pay

## 2019-04-10 ENCOUNTER — Telehealth: Payer: Self-pay | Admitting: Gastroenterology

## 2019-04-10 VITALS — Ht 70.0 in | Wt 215.0 lb

## 2019-04-10 DIAGNOSIS — Z1211 Encounter for screening for malignant neoplasm of colon: Secondary | ICD-10-CM

## 2019-04-10 DIAGNOSIS — K760 Fatty (change of) liver, not elsewhere classified: Secondary | ICD-10-CM

## 2019-04-10 DIAGNOSIS — Z1212 Encounter for screening for malignant neoplasm of rectum: Secondary | ICD-10-CM

## 2019-04-10 DIAGNOSIS — R1030 Lower abdominal pain, unspecified: Secondary | ICD-10-CM

## 2019-04-10 MED ORDER — NA SULFATE-K SULFATE-MG SULF 17.5-3.13-1.6 GM/177ML PO SOLN: 1.0000 | Freq: Once | ORAL | 0 refills | Status: AC

## 2019-04-10 NOTE — Addendum Note (Signed)
Addended by: Herma Mering D on: 04/10/2019 04:41 PM   Modules accepted: Orders

## 2019-04-10 NOTE — Telephone Encounter (Signed)
Pharmacy called would like to know if it is 2 or 1 kit for the prep

## 2019-04-10 NOTE — Addendum Note (Signed)
Addended by: Herma Mering D on: 04/10/2019 11:37 AM   Modules accepted: Orders

## 2019-04-10 NOTE — Progress Notes (Signed)
Chief Complaint: for colon/liver  Referring Provider:  Dr Bea Graff      ASSESSMENT AND PLAN;   #1.  Colorectal cancer screening.  #2.  Fatty liver with Nl LFTs. Liver Bx-12/2013 Gd1/Stage 0 steatohepatitis without fibrosis. Has assoc DM2, obesity, HLD/hypertriglyceridemia.  #3.  Intermittent constipation with lower abdominal pain.  Plan; - Miralax 17g po qd. - Proceed with colonoscopy with 2 day prep. Discussed risks & benefits. (Risks including rare perforation req laparotomy, bleeding after biopsies/polypectomy req blood transfusion, rare chance of missing neoplasms, risks of anesthesia/sedation). Benefits outweigh the risks. Patient agrees to proceed. All the questions were answered.  - [place] abdo complete. - Encouraged to lose weight.  His DM/HLD is under good control. He is under excellent care with Dr. Bea Graff. - If still with problems and the above WU is negative, proceed with CT scan abdo/pelvis.    HPI:    Rodney Harper is a 63 y.o. male  No nausea, vomiting, heartburn, regurgitation, odynophagia or dysphagia.  No significant diarrhea. There is no melena or hematochezia. No unintentional weight loss.  Did have lower abdominal pain with intermittent constipation and abdominal bloating which gets better with defecation.  No history of itching, skin lesions, easy bruisability, intake of over-the-counter medications including diet pills, herbal medications, anabolic steroids or Tylenol.  There is no history of blood transfusions, IV drug use or family history of liver disease.  No jaundice dark urine or pale stools.    H/O alcohol abuse in remote past.  No alcohol intake over last 5 years.  Review of his labs-normal LFTs with AST 23, ALT 22, albumin 4.3 on 03/20/2019.  Normal platelet count.  No evidence of liver cirrhosis.  Past GI procedures: Colonoscopy 05/2012 ([facility])-poor prep, mild sigmoid diverticulosis, small internal hemorrhoids. Past Medical History:  Diagnosis Date   . Alcohol abuse - H/O   . Anxiety   . Benign essential HTN   . Benign prostatic hyperplasia without lower urinary tract symptoms   . Chronic edema       . Hypogonadism in male   . Lumbar disc herniation with radiculopathy   . Mild episode of recurrent major depressive disorder ([place])   . Mixed hyperlipidemia   . Pain disorder   . Primary insomnia   . Type 2 diabetes mellitus with diabetic polyneuropathy, with long-term current use of insulin ([place])   . Vitamin B12 deficiency   . Vitamin D deficiency     Past Surgical History:  Procedure Laterality Date  . APPENDECTOMY    . BACK SURGERY    . COLONOSCOPY  05/07/2012   Mild sigmoid diverticulosis. Small internal hemorrhoids. OTherwise normal colonoscopy.     Family History  Problem Relation Age of Onset  . Throat cancer Mother   . Heart disease Father   . Heart attack Father   . Hypertension Father   . Colon cancer Neg Hx     Social History   Tobacco Use  . Smoking status: Never Smoker  . Smokeless tobacco: Never Used  Substance Use Topics  . Alcohol use: Not Currently  . Drug use: Not on file    Current Outpatient Medications  Medication Sig Dispense Refill  . ALPRAZolam (XANAX) 0.5 MG tablet Take 0.5 mg by mouth every 8 (eight) hours as needed.    . baclofen (LIORESAL) 10 MG tablet Take 10 mg by mouth 3 (three) times daily.    . Cholecalciferol (VITAMIN D-1000 MAX ST) 25 MCG (1000 UT) tablet Take 1,000  Units by mouth daily.    Marland Kitchen escitalopram (LEXAPRO) 20 MG tablet Take 20 mg by mouth daily.    Marland Kitchen ezetimibe (ZETIA) 10 MG tablet Take 10 mg by mouth at bedtime.    . furosemide (LASIX) 20 MG tablet Take 20 mg by mouth daily.    Marland Kitchen glipiZIDE (GLUCOTROL XL) 5 MG 24 hr tablet Take 5 mg by mouth daily.    Marland Kitchen losartan-hydrochlorothiazide (HYZAAR) 50-12.5 MG tablet Take 1 tablet by mouth daily.    . metFORMIN (GLUCOPHAGE-XR) 500 MG 24 hr tablet Take 500 mg by mouth 2 (two) times a day.    . metoprolol succinate (TOPROL-XL)  100 MG 24 hr tablet Take 100 mg by mouth daily.    . mupirocin ointment (BACTROBAN) 2 % Apply topically 2 (two) times daily as needed.    Marland Kitchen oxyCODONE-acetaminophen (PERCOCET) 10-325 MG tablet Take 1 tablet by mouth every 6 (six) hours as needed.    . pregabalin (LYRICA) 75 MG capsule Take 75 mg by mouth daily.    Marland Kitchen rOPINIRole (REQUIP) 0.25 MG tablet Take 1-2 tablets by mouth at bedtime as needed.    . sildenafil (REVATIO) 20 MG tablet Take 5 tablets by mouth as directed.    . terazosin (HYTRIN) 2 MG capsule Take 2 mg by mouth at bedtime.    Marland Kitchen testosterone cypionate (DEPOTESTOSTERONE CYPIONATE) 200 MG/ML injection Inject 200 mg into the muscle every 14 (fourteen) days.    . vitamin B-12 (CYANOCOBALAMIN) 1000 MCG tablet Take 1,000 mcg by mouth daily.     No current facility-administered medications for this visit.     Allergies  Allergen Reactions  . Carbamazepine Other (See Comments)    Unknown  . Clonidine Rash    Review of Systems:  Constitutional: Denies fever, chills, diaphoresis, appetite change and fatigue.  HEENT: Denies photophobia, eye pain, redness, hearing loss, ear pain, congestion, sore throat, rhinorrhea, sneezing, mouth sores, neck pain, neck stiffness and tinnitus.   Respiratory: Denies SOB, DOE, cough, chest tightness,  and wheezing.   Cardiovascular: Denies chest pain, palpitations and leg swelling.  Genitourinary: Denies dysuria, urgency, frequency, hematuria, flank pain and difficulty urinating.  Musculoskeletal: Has myalgias, back pain, joint swelling, arthralgias and gait problem.  Skin: No rash.  Neurological: Denies dizziness, seizures, syncope, weakness, light-headedness, numbness and headaches.  Hematological: Denies adenopathy. Easy bruising, personal or family bleeding history  Psychiatric/Behavioral: Has anxiety or depression     Physical Exam:    Ht 5\' 10"  (1.778 m)   Wt 215 lb (97.5 kg)   BMI 30.85 kg/m  Filed Weights   04/10/19 0901  Weight:  215 lb (97.5 kg)   Constitutional:  Well-developed, in no acute distress. Psychiatric: Normal mood and affect. Behavior is normal.   Data Reviewed: I have personally reviewed following labs and imaging studies Labs   This service was provided via Doxy video visit.  The patient was located at home.  The provider was located in office.  The patient did consent to this telephone visit and is aware of possible charges through their insurance for this visit.  The patient was referred by Dr. Bea Graff.  The other persons participating in this telemedicine service were patient's wife and their role was assistance (she was having a rough morning with migraine headaches)  Time spent on call/review of records/coordination of care: 45 min     Carmell Austria, MD 04/10/2019, 9:27 AM  Cc: Dr Bea Graff.

## 2019-04-10 NOTE — Patient Instructions (Addendum)
If you are age 62 or older, your body mass index should be between 23-30. Your Body mass index is 30.85 kg/m. If this is out of the aforementioned range listed, please consider follow up with your Primary Care Provider.  If you are age 58 or younger, your body mass index should be between 19-25. Your Body mass index is 30.85 kg/m. If this is out of the aformentioned range listed, please consider follow up with your Primary Care Provider.   To help prevent the possible spread of infection to our patients, communities, and staff; we will be implementing the following measures:  As of now we are not allowing any visitors/family members to accompany you to any upcoming appointments with [facility]. If you have any concerns about this please contact our office to discuss prior to the appointment.   You have been scheduled for a colonoscopy. Please follow written instructions given to you at your visit today.  Please pick up your prep supplies at the pharmacy within the next 1-3 days. If you use inhalers (even only as needed), please bring them with you on the day of your procedure. Your physician has requested that you go to www.startemmi.com and enter the access code given to you at your visit today. This web site gives a general overview about your procedure. However, you should still follow specific instructions given to you by our office regarding your preparation for the procedure.  We have sent the following medications to your pharmacy for you to pick up at your convenience: Suprep  Please purchase the following medications over the counter and take as directed: Miralax 17g by mouth daily  Thank you,  Dr. Jackquline Denmark

## 2019-04-13 NOTE — Telephone Encounter (Signed)
I have called and clarified with pharmacy.

## 2019-04-16 ENCOUNTER — Ambulatory Visit (HOSPITAL_BASED_OUTPATIENT_CLINIC_OR_DEPARTMENT_OTHER)
Admission: RE | Admit: 2019-04-16 | Discharge: 2019-04-16 | Disposition: A | Discharge status: Home / Self Care | Payer: PPO | Source: Ambulatory Visit | Attending: Gastroenterology | Admitting: Gastroenterology

## 2019-04-16 ENCOUNTER — Other Ambulatory Visit: Payer: Self-pay

## 2019-04-16 DIAGNOSIS — K760 Fatty (change of) liver, not elsewhere classified: Secondary | ICD-10-CM | POA: Insufficient documentation

## 2019-04-16 DIAGNOSIS — I700 Atherosclerosis of aorta: Secondary | ICD-10-CM | POA: Diagnosis not present

## 2019-04-16 DIAGNOSIS — R1030 Lower abdominal pain, unspecified: Secondary | ICD-10-CM | POA: Diagnosis not present

## 2019-04-16 DIAGNOSIS — N200 Calculus of kidney: Secondary | ICD-10-CM | POA: Diagnosis not present

## 2019-04-16 DIAGNOSIS — E291 Testicular hypofunction: Secondary | ICD-10-CM | POA: Diagnosis not present

## 2019-04-16 DIAGNOSIS — R140 Abdominal distension (gaseous): Secondary | ICD-10-CM | POA: Diagnosis not present

## 2019-04-17 ENCOUNTER — Telehealth: Payer: Self-pay | Admitting: Gastroenterology

## 2019-04-17 NOTE — Telephone Encounter (Signed)
Please call [phone] to speak with the wife thanks

## 2019-04-17 NOTE — Telephone Encounter (Signed)
Patient wife called back in wanting to speak with the nurse. She stated that the new prep that was ordered has different instructions and she is needing to go over them

## 2019-04-17 NOTE — Telephone Encounter (Signed)
I have went over new instructions for Miralax instructions with patients wife at patients request. She voiced understanding.

## 2019-04-17 NOTE — Telephone Encounter (Signed)
Patient pharmacy called and stated that the prep was to high with the patients insurance

## 2019-04-20 ENCOUNTER — Telehealth: Payer: Self-pay | Admitting: Gastroenterology

## 2019-04-20 NOTE — Telephone Encounter (Signed)
I have called and lmom for a return phone call.

## 2019-04-20 NOTE — Telephone Encounter (Signed)

## 2019-04-20 NOTE — Telephone Encounter (Signed)
Patient would like to verify she wrote down the instructions correct for the procedure tomorrow.

## 2019-04-21 ENCOUNTER — Encounter: Payer: Self-pay | Admitting: Gastroenterology

## 2019-04-21 ENCOUNTER — Other Ambulatory Visit: Payer: Self-pay

## 2019-04-21 ENCOUNTER — Ambulatory Visit (AMBULATORY_SURGERY_CENTER): Payer: PPO | Admitting: Gastroenterology

## 2019-04-21 VITALS — BP 110/48 | HR 56 | Temp 97.4°F | Resp 13 | Ht 70.0 in | Wt 215.0 lb

## 2019-04-21 DIAGNOSIS — R1030 Lower abdominal pain, unspecified: Secondary | ICD-10-CM | POA: Diagnosis not present

## 2019-04-21 DIAGNOSIS — Z1211 Encounter for screening for malignant neoplasm of colon: Secondary | ICD-10-CM

## 2019-04-21 MED ORDER — SODIUM CHLORIDE 0.9 % IV SOLN: 500.0000 mL | Freq: Once | INTRAVENOUS | Status: DC

## 2019-04-21 NOTE — Op Note (Signed)
[facility] Patient Name: Rodney Harper Procedure Date: 04/21/2019 10:24 AM MRN: [id number] Endoscopist: Jackquline Denmark , MD Age: 63 Referring MD:  Date of Birth: 10-Mar-1956 Gender: Male Account #: [id number] Procedure:                Colonoscopy Indications:              Screening for colorectal malignant neoplasm Medicines:                Monitored Anesthesia Care Procedure:                Pre-Anesthesia Assessment:                           - Prior to the procedure, a History and Physical                            was performed, and patient medications and                            allergies were reviewed. The patient's tolerance of                            previous anesthesia was also reviewed. The risks                            and benefits of the procedure and the sedation                            options and risks were discussed with the patient.                            All questions were answered, and informed consent                            was obtained. Prior Anticoagulants: The patient has                            taken no previous anticoagulant or antiplatelet                            agents. ASA Grade Assessment: III - A patient with                            severe systemic disease. After reviewing the risks                            and benefits, the patient was deemed in                            satisfactory condition to undergo the procedure.                           After obtaining informed consent, the colonoscope  was passed under direct vision. Throughout the                            procedure, the patient's blood pressure, pulse, and                            oxygen saturations were monitored continuously. The                            Colonoscope was introduced through the anus and                            advanced to the the cecum, identified by                            appendiceal orifice and  ileocecal valve. The                            colonoscopy was performed without difficulty. The                            patient tolerated the procedure well. The quality                            of the bowel preparation was good except in some                            areas of the sigmoid colon and in the cecum where                            there was some adherent stool which could not be                            fully washed. Overall, the examination was                            adequate. The ileocecal valve, appendiceal orifice,                            and rectum were photographed. Scope In: 10:54:47 AM Scope Out: 11:11:14 AM Scope Withdrawal Time: 0 hours 9 minutes 40 seconds  Total Procedure Duration: 0 hours 16 minutes 27 seconds  Findings:                 A few small-mouthed diverticula were found in the                            sigmoid colon.                           Non-bleeding internal hemorrhoids were found during                            retroflexion. The hemorrhoids were small.  The exam was otherwise without abnormality. Complications:            No immediate complications. Estimated Blood Loss:     Estimated blood loss: none. Impression:               -Mild sigmoid diverticulosis.                           -Small internal hemorrhoids.                           -Otherwise normal colonoscopy. Recommendation:           - Patient has a contact number available for                            emergencies. The signs and symptoms of potential                            delayed complications were discussed with the                            patient. Return to normal activities tomorrow.                            Written discharge instructions were provided to the                            patient.                           - Resume previous diet.                           - Miralax 1 capful (17 grams) in 8 ounces of water                             PO daily.                           - Minimize pain medications.                           - Repeat colonoscopy in 10 years for screening                            purposes. Earlier, if with any new problems or if                            there is any change in family history.                           - Return to GI office PRN. Jackquline Denmark, MD 04/21/2019 11:18:09 AM This report has been signed electronically.

## 2019-04-21 NOTE — Patient Instructions (Signed)
Please read handouts provided. Minimize pain medications. Miralax 1 capful ( 17 grams ) in 8 ounces of water daily. Return to GI office as needed.     YOU HAD AN ENDOSCOPIC PROCEDURE TODAY AT [facility]:   Refer to the procedure report that was given to you for any specific questions about what was found during the examination.  If the procedure report does not answer your questions, please call your gastroenterologist to clarify.  If you requested that your care partner not be given the details of your procedure findings, then the procedure report has been included in a sealed envelope for you to review at your convenience later.  YOU SHOULD EXPECT: Some feelings of bloating in the abdomen. Passage of more gas than usual.  Walking can help get rid of the air that was put into your GI tract during the procedure and reduce the bloating. If you had a lower endoscopy (such as a colonoscopy or flexible sigmoidoscopy) you may notice spotting of blood in your stool or on the toilet paper. If you underwent a bowel prep for your procedure, you may not have a normal bowel movement for a few days.  Please Note:  You might notice some irritation and congestion in your nose or some drainage.  This is from the oxygen used during your procedure.  There is no need for concern and it should clear up in a day or so.  SYMPTOMS TO REPORT IMMEDIATELY:   Following lower endoscopy (colonoscopy or flexible sigmoidoscopy):  Excessive amounts of blood in the stool  Significant tenderness or worsening of abdominal pains  Swelling of the abdomen that is new, acute  Fever of 100F or higher   For urgent or emergent issues, a gastroenterologist can be reached at any hour by calling [phone].   DIET:  We do recommend a small meal at first, but then you may proceed to your regular diet.  Drink plenty of fluids but you should avoid alcoholic beverages for 24 hours.  ACTIVITY:  You should plan to  take it easy for the rest of today and you should NOT DRIVE or use heavy machinery until tomorrow (because of the sedation medicines used during the test).    FOLLOW UP: Our staff will call the number listed on your records 48-72 hours following your procedure to check on you and address any questions or concerns that you may have regarding the information given to you following your procedure. If we do not reach you, we will leave a message.  We will attempt to reach you two times.  During this call, we will ask if you have developed any symptoms of COVID 19. If you develop any symptoms (ie: fever, flu-like symptoms, shortness of breath, cough etc.) before then, please call [phone].  If you test positive for Covid 19 in the 2 weeks post procedure, please call and report this information to [place].    If any biopsies were taken you will be contacted by phone or by letter within the next 1-3 weeks.  Please call us at [phone] if you have not heard about the biopsies in 3 weeks.    SIGNATURES/CONFIDENTIALITY: You and/or your care partner have signed paperwork which will be entered into your electronic medical record.  These signatures attest to the fact that that the information above on your After Visit Summary has been reviewed and is understood.  Full responsibility of the confidentiality of this discharge information lies with you  and/or your care-partner. 

## 2019-04-21 NOTE — Progress Notes (Signed)
PT taken to PACU. Monitors in place. VSS. Report given to RN. 

## 2019-04-21 NOTE — Progress Notes (Signed)
Pt's states no medical or surgical changes since previsit or office visit.  [place] temps and Rica Mote CMA vitals.  CBG was 85, pt took Metformin this am. Started D5W IV, normally pt states the he would "be sweating and having symptoms" of hypoglycemia. Will recheck CBG in 15 min. SM

## 2019-04-23 ENCOUNTER — Telehealth: Payer: Self-pay

## 2019-04-23 NOTE — Telephone Encounter (Signed)
  Follow up Call-  Call back number 04/21/2019  Post procedure Call Back phone  # [phone]  Permission to leave phone message Yes  Some recent data might be hidden     Patient questions:  Do you have a fever, pain , or abdominal swelling? No. Pain Score  0 *  Have you tolerated food without any problems? Yes.    Have you been able to return to your normal activities? Yes.    Do you have any questions about your discharge instructions: Diet   No. Medications  No. Follow up visit  No.  Do you have questions or concerns about your Care? No.  Actions: * If pain score is 4 or above: No action needed, pain <4.  1. Have you developed a fever since your procedure? no  2.   Have you had an respiratory symptoms (SOB or cough) since your procedure? no  3.   Have you tested positive for COVID 19 since your procedure no  4.   Have you had any family members/close contacts diagnosed with the COVID 19 since your procedure?  no   If yes to any of these questions please route to Joylene John, RN and Alphonsa Gin, Therapist, sports.

## 2019-04-27 DIAGNOSIS — H5015 Alternating exotropia: Secondary | ICD-10-CM | POA: Diagnosis not present

## 2019-04-27 DIAGNOSIS — H5203 Hypermetropia, bilateral: Secondary | ICD-10-CM | POA: Diagnosis not present

## 2019-04-27 DIAGNOSIS — H2513 Age-related nuclear cataract, bilateral: Secondary | ICD-10-CM | POA: Diagnosis not present

## 2019-04-30 DIAGNOSIS — E291 Testicular hypofunction: Secondary | ICD-10-CM | POA: Diagnosis not present

## 2019-05-14 DIAGNOSIS — N400 Enlarged prostate without lower urinary tract symptoms: Secondary | ICD-10-CM | POA: Diagnosis not present

## 2019-05-14 DIAGNOSIS — E291 Testicular hypofunction: Secondary | ICD-10-CM | POA: Diagnosis not present

## 2019-05-28 DIAGNOSIS — E291 Testicular hypofunction: Secondary | ICD-10-CM | POA: Diagnosis not present

## 2019-06-19 DIAGNOSIS — E291 Testicular hypofunction: Secondary | ICD-10-CM | POA: Diagnosis not present

## 2019-07-02 DIAGNOSIS — E291 Testicular hypofunction: Secondary | ICD-10-CM | POA: Diagnosis not present

## 2019-07-16 DIAGNOSIS — U071 COVID-19: Secondary | ICD-10-CM | POA: Diagnosis not present

## 2019-07-16 DIAGNOSIS — Z20828 Contact with and (suspected) exposure to other viral communicable diseases: Secondary | ICD-10-CM | POA: Diagnosis not present

## 2019-07-25 DIAGNOSIS — R197 Diarrhea, unspecified: Secondary | ICD-10-CM | POA: Diagnosis not present

## 2019-07-25 DIAGNOSIS — R0602 Shortness of breath: Secondary | ICD-10-CM | POA: Diagnosis not present

## 2019-07-25 DIAGNOSIS — E1142 Type 2 diabetes mellitus with diabetic polyneuropathy: Secondary | ICD-10-CM | POA: Diagnosis not present

## 2019-07-25 DIAGNOSIS — Z7984 Long term (current) use of oral hypoglycemic drugs: Secondary | ICD-10-CM | POA: Diagnosis not present

## 2019-07-25 DIAGNOSIS — R9431 Abnormal electrocardiogram [ECG] [EKG]: Secondary | ICD-10-CM | POA: Diagnosis not present

## 2019-07-25 DIAGNOSIS — Z79891 Long term (current) use of opiate analgesic: Secondary | ICD-10-CM | POA: Diagnosis not present

## 2019-07-25 DIAGNOSIS — G8929 Other chronic pain: Secondary | ICD-10-CM | POA: Diagnosis not present

## 2019-07-25 DIAGNOSIS — G2581 Restless legs syndrome: Secondary | ICD-10-CM | POA: Diagnosis not present

## 2019-07-25 DIAGNOSIS — I10 Essential (primary) hypertension: Secondary | ICD-10-CM | POA: Diagnosis not present

## 2019-07-25 DIAGNOSIS — Z87891 Personal history of nicotine dependence: Secondary | ICD-10-CM | POA: Diagnosis not present

## 2019-07-25 DIAGNOSIS — F329 Major depressive disorder, single episode, unspecified: Secondary | ICD-10-CM | POA: Diagnosis not present

## 2019-07-25 DIAGNOSIS — J1289 Other viral pneumonia: Secondary | ICD-10-CM | POA: Diagnosis not present

## 2019-07-25 DIAGNOSIS — M545 Low back pain: Secondary | ICD-10-CM | POA: Diagnosis not present

## 2019-07-25 DIAGNOSIS — G5603 Carpal tunnel syndrome, bilateral upper limbs: Secondary | ICD-10-CM | POA: Diagnosis not present

## 2019-07-25 DIAGNOSIS — E538 Deficiency of other specified B group vitamins: Secondary | ICD-10-CM | POA: Diagnosis not present

## 2019-07-25 DIAGNOSIS — Z888 Allergy status to other drugs, medicaments and biological substances status: Secondary | ICD-10-CM | POA: Diagnosis not present

## 2019-07-25 DIAGNOSIS — Z808 Family history of malignant neoplasm of other organs or systems: Secondary | ICD-10-CM | POA: Diagnosis not present

## 2019-07-25 DIAGNOSIS — U071 COVID-19: Secondary | ICD-10-CM | POA: Diagnosis not present

## 2019-07-25 DIAGNOSIS — Z79899 Other long term (current) drug therapy: Secondary | ICD-10-CM | POA: Diagnosis not present

## 2019-07-25 DIAGNOSIS — F5101 Primary insomnia: Secondary | ICD-10-CM | POA: Diagnosis not present

## 2019-07-25 DIAGNOSIS — F419 Anxiety disorder, unspecified: Secondary | ICD-10-CM | POA: Diagnosis not present

## 2019-07-25 DIAGNOSIS — R0902 Hypoxemia: Secondary | ICD-10-CM | POA: Diagnosis not present

## 2019-07-25 DIAGNOSIS — R519 Headache, unspecified: Secondary | ICD-10-CM | POA: Diagnosis not present

## 2019-07-25 DIAGNOSIS — J9601 Acute respiratory failure with hypoxia: Secondary | ICD-10-CM | POA: Diagnosis not present

## 2019-07-25 DIAGNOSIS — E559 Vitamin D deficiency, unspecified: Secondary | ICD-10-CM | POA: Diagnosis not present

## 2019-07-25 DIAGNOSIS — M5116 Intervertebral disc disorders with radiculopathy, lumbar region: Secondary | ICD-10-CM | POA: Diagnosis not present

## 2019-07-25 DIAGNOSIS — K7460 Unspecified cirrhosis of liver: Secondary | ICD-10-CM | POA: Diagnosis not present

## 2019-07-25 DIAGNOSIS — E782 Mixed hyperlipidemia: Secondary | ICD-10-CM | POA: Diagnosis not present

## 2019-07-25 DIAGNOSIS — Z8249 Family history of ischemic heart disease and other diseases of the circulatory system: Secondary | ICD-10-CM | POA: Diagnosis not present

## 2019-07-25 DIAGNOSIS — F5104 Psychophysiologic insomnia: Secondary | ICD-10-CM | POA: Diagnosis not present

## 2019-07-25 DIAGNOSIS — E119 Type 2 diabetes mellitus without complications: Secondary | ICD-10-CM | POA: Diagnosis not present

## 2019-07-25 DIAGNOSIS — N400 Enlarged prostate without lower urinary tract symptoms: Secondary | ICD-10-CM | POA: Diagnosis not present

## 2019-07-25 DIAGNOSIS — G40909 Epilepsy, unspecified, not intractable, without status epilepticus: Secondary | ICD-10-CM | POA: Diagnosis not present

## 2019-07-25 DIAGNOSIS — K5909 Other constipation: Secondary | ICD-10-CM | POA: Diagnosis not present

## 2019-07-27 DIAGNOSIS — I10 Essential (primary) hypertension: Secondary | ICD-10-CM | POA: Diagnosis not present

## 2019-07-27 DIAGNOSIS — R0902 Hypoxemia: Secondary | ICD-10-CM | POA: Diagnosis not present

## 2019-07-27 DIAGNOSIS — R0602 Shortness of breath: Secondary | ICD-10-CM | POA: Diagnosis not present

## 2019-07-27 DIAGNOSIS — U071 COVID-19: Secondary | ICD-10-CM | POA: Diagnosis not present

## 2019-07-28 DIAGNOSIS — R0602 Shortness of breath: Secondary | ICD-10-CM | POA: Diagnosis not present

## 2019-07-30 DIAGNOSIS — J9601 Acute respiratory failure with hypoxia: Secondary | ICD-10-CM | POA: Diagnosis not present

## 2019-07-30 DIAGNOSIS — J189 Pneumonia, unspecified organism: Secondary | ICD-10-CM | POA: Diagnosis not present

## 2019-07-30 DIAGNOSIS — Z87891 Personal history of nicotine dependence: Secondary | ICD-10-CM | POA: Diagnosis not present

## 2019-07-30 DIAGNOSIS — U071 COVID-19: Secondary | ICD-10-CM | POA: Diagnosis not present

## 2019-09-23 DIAGNOSIS — E291 Testicular hypofunction: Secondary | ICD-10-CM | POA: Diagnosis not present

## 2019-09-23 DIAGNOSIS — F410 Panic disorder [episodic paroxysmal anxiety] without agoraphobia: Secondary | ICD-10-CM | POA: Diagnosis not present

## 2019-09-23 DIAGNOSIS — Z8619 Personal history of other infectious and parasitic diseases: Secondary | ICD-10-CM | POA: Diagnosis not present

## 2019-09-23 DIAGNOSIS — E782 Mixed hyperlipidemia: Secondary | ICD-10-CM | POA: Diagnosis not present

## 2019-09-23 DIAGNOSIS — N401 Enlarged prostate with lower urinary tract symptoms: Secondary | ICD-10-CM | POA: Diagnosis not present

## 2019-09-23 DIAGNOSIS — G2581 Restless legs syndrome: Secondary | ICD-10-CM | POA: Diagnosis not present

## 2019-09-23 DIAGNOSIS — F5101 Primary insomnia: Secondary | ICD-10-CM | POA: Diagnosis not present

## 2019-09-23 DIAGNOSIS — F330 Major depressive disorder, recurrent, mild: Secondary | ICD-10-CM | POA: Diagnosis not present

## 2019-09-23 DIAGNOSIS — E538 Deficiency of other specified B group vitamins: Secondary | ICD-10-CM | POA: Diagnosis not present

## 2019-09-23 DIAGNOSIS — Z23 Encounter for immunization: Secondary | ICD-10-CM | POA: Diagnosis not present

## 2019-09-23 DIAGNOSIS — E1142 Type 2 diabetes mellitus with diabetic polyneuropathy: Secondary | ICD-10-CM | POA: Diagnosis not present

## 2019-09-23 DIAGNOSIS — Z79899 Other long term (current) drug therapy: Secondary | ICD-10-CM | POA: Diagnosis not present

## 2019-09-23 DIAGNOSIS — I10 Essential (primary) hypertension: Secondary | ICD-10-CM | POA: Diagnosis not present

## 2019-10-06 DIAGNOSIS — R413 Other amnesia: Secondary | ICD-10-CM | POA: Diagnosis not present

## 2019-10-06 DIAGNOSIS — Z79899 Other long term (current) drug therapy: Secondary | ICD-10-CM | POA: Diagnosis not present

## 2019-10-06 DIAGNOSIS — R269 Unspecified abnormalities of gait and mobility: Secondary | ICD-10-CM | POA: Diagnosis not present

## 2019-10-06 DIAGNOSIS — M5116 Intervertebral disc disorders with radiculopathy, lumbar region: Secondary | ICD-10-CM | POA: Diagnosis not present

## 2019-10-06 DIAGNOSIS — R569 Unspecified convulsions: Secondary | ICD-10-CM | POA: Diagnosis not present

## 2019-11-18 DIAGNOSIS — H5711 Ocular pain, right eye: Secondary | ICD-10-CM | POA: Diagnosis not present

## 2019-11-18 DIAGNOSIS — E119 Type 2 diabetes mellitus without complications: Secondary | ICD-10-CM | POA: Diagnosis not present

## 2019-11-18 DIAGNOSIS — H50331 Intermittent monocular exotropia, right eye: Secondary | ICD-10-CM | POA: Diagnosis not present

## 2019-11-18 DIAGNOSIS — Z7984 Long term (current) use of oral hypoglycemic drugs: Secondary | ICD-10-CM | POA: Diagnosis not present

## 2019-11-18 DIAGNOSIS — H2513 Age-related nuclear cataract, bilateral: Secondary | ICD-10-CM | POA: Diagnosis not present

## 2019-11-18 DIAGNOSIS — D3131 Benign neoplasm of right choroid: Secondary | ICD-10-CM | POA: Diagnosis not present

## 2019-12-07 IMAGING — US ULTRASOUND ABDOMEN COMPLETE
1 series · 13 of 25 positions shown · non-contrast
Comparison: October 20, 2014

CLINICAL DATA: Abdominal pain with bloating

EXAM:
ABDOMEN ULTRASOUND COMPLETE

[Series 1: ultrasound abdomen complete · 13 of 117 slices shown]
[im 1/117]
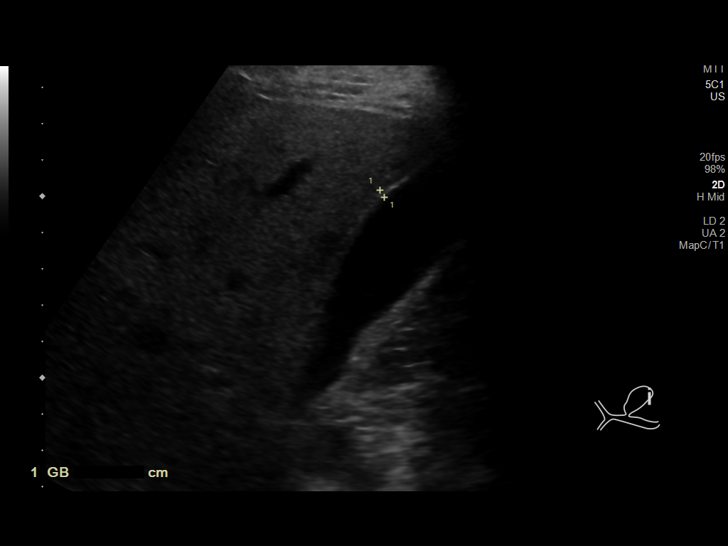
[im 10/117]
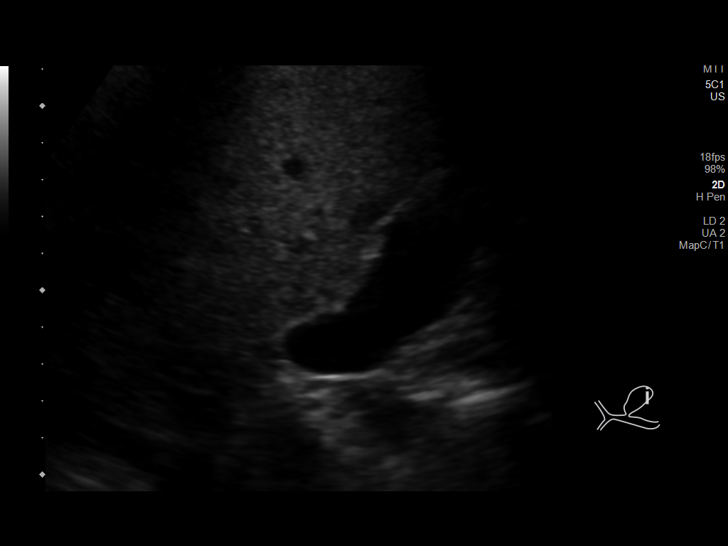
[im 20/117]
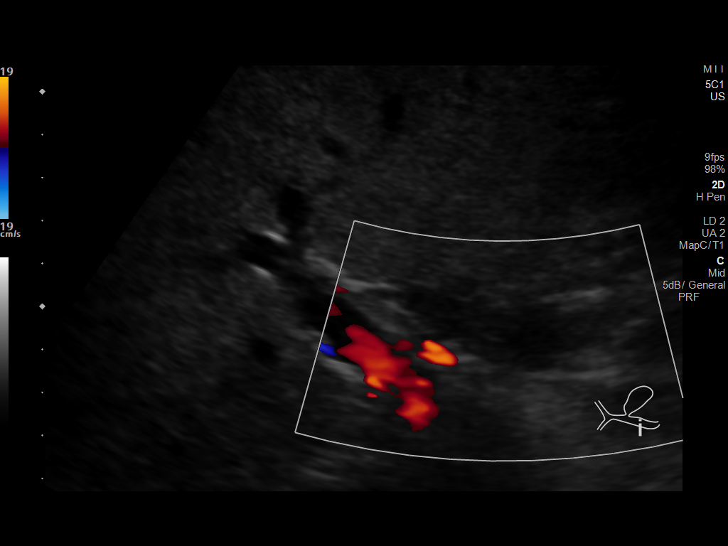
[im 30/117]
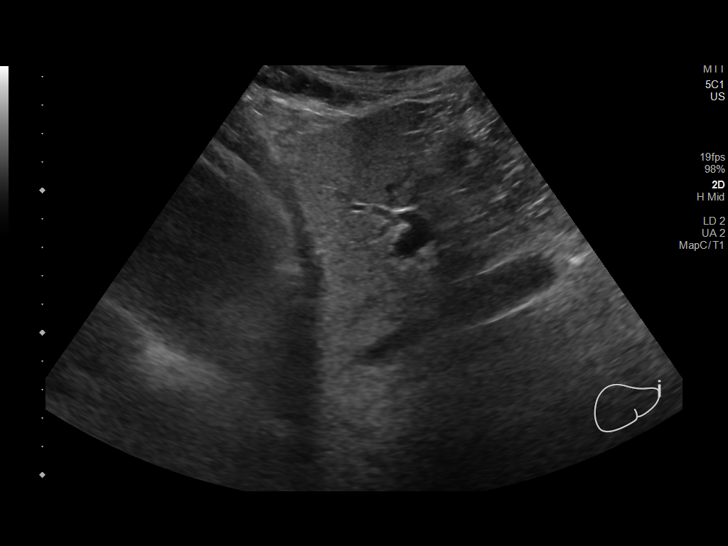
[im 39/117]
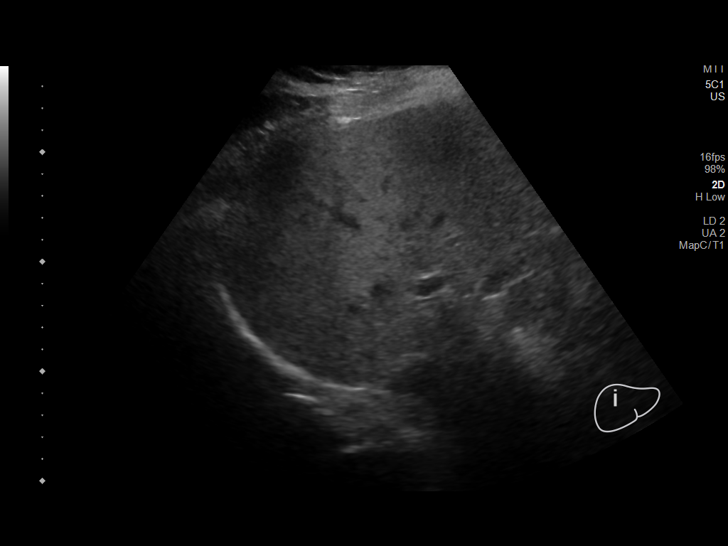
[im 49/117]
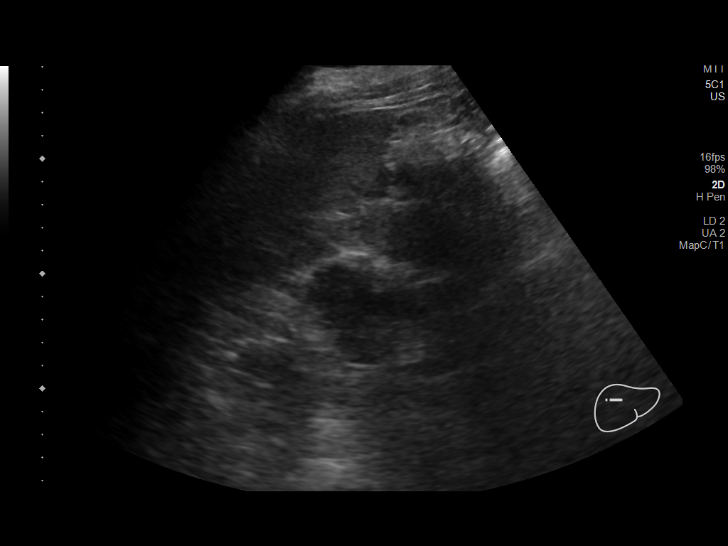
[im 59/117]
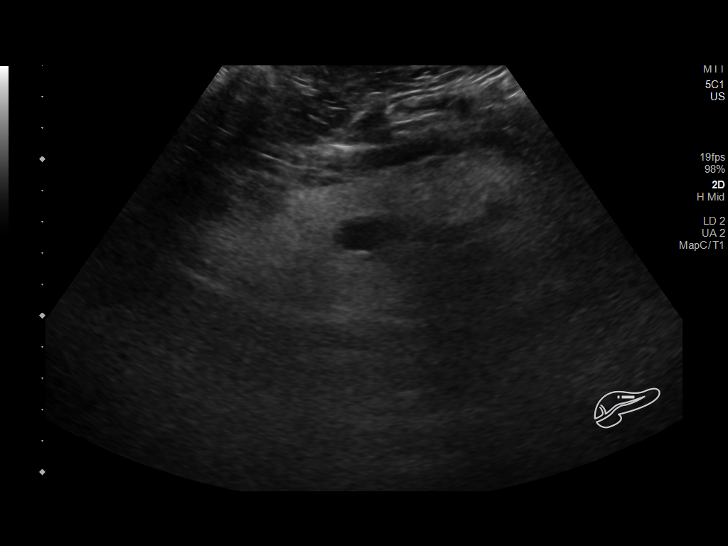
[im 68/117]
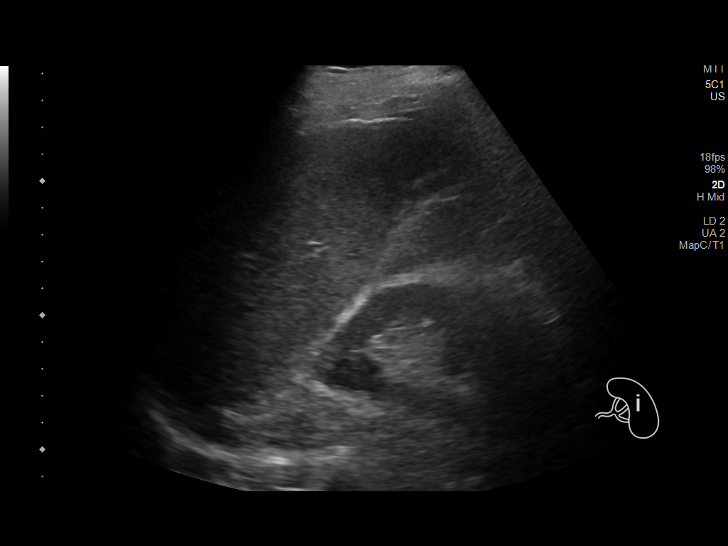
[im 78/117]
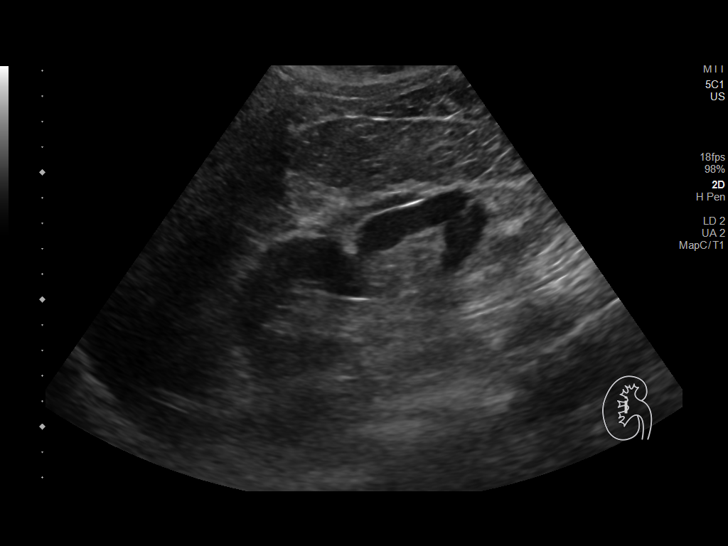
[im 88/117]
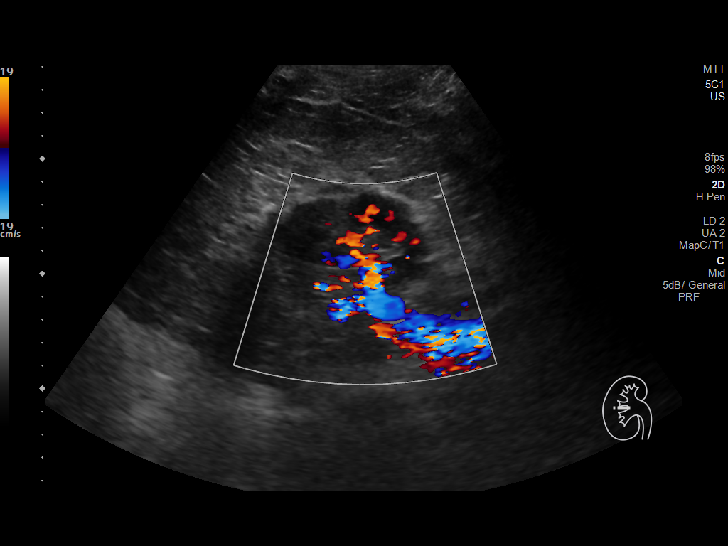
[im 97/117]
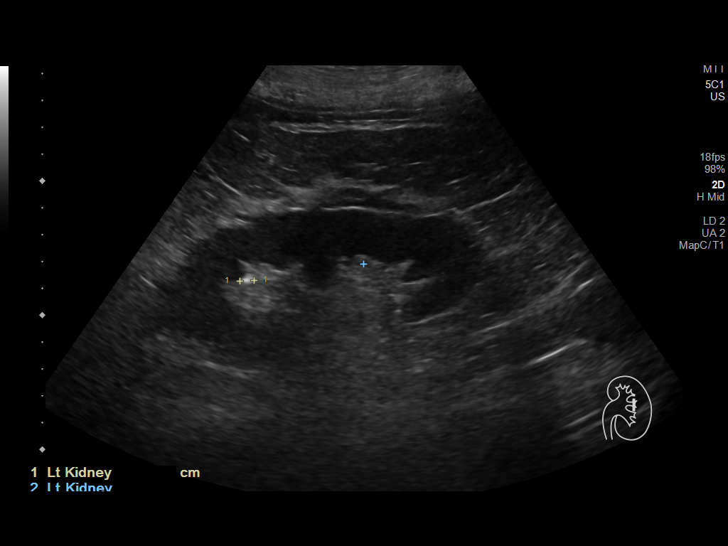
[im 107/117]
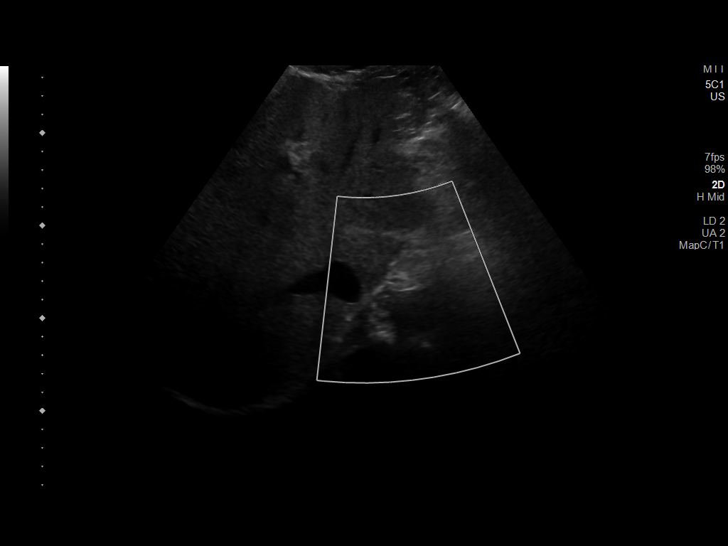
[im 117/117]
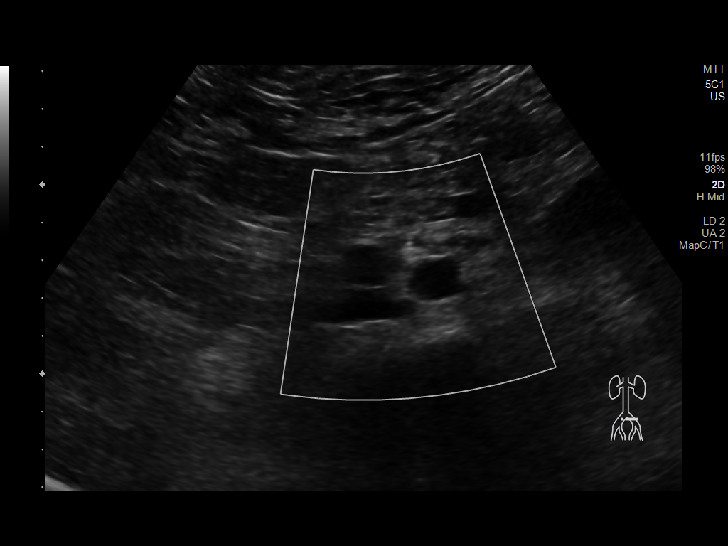

[13 of 25 positions shown; findings below may reference images not displayed]

FINDINGS: Gallbladder: No gallstones or wall thickening visualized. There is
no pericholecystic fluid. No sonographic Murphy sign noted by
sonographer.

Common bile duct: Diameter: 4 mm. No intrahepatic, common hepatic,
or common bile duct dilatation.

Liver: No focal lesion identified. Liver echogenicity overall is
increased. There may be mild fatty sparing near the gallbladder
fossa. Portal vein is patent on color Doppler imaging with normal
direction of blood flow towards the liver.

IVC: No abnormality visualized.

Pancreas: Visualized portion unremarkable. Portions of pancreas
obscured by gas.

Spleen: Size and appearance within normal limits.

Right Kidney: Length: 12.2 cm. Echogenicity within normal limits. No
mass or hydronephrosis visualized. There is an 8 mm calculus in the
upper pole the right kidney.

Left Kidney: Length: 12.1 cm. Echogenicity within normal limits. No
mass or hydronephrosis visualized. There is a 5 mm calculus in the
upper pole left kidney.

Abdominal aorta: No aneurysm visualized. There is aortic
atherosclerosis.

Other findings: No demonstrable ascites.
IMPRESSION: 1. Increased liver echogenicity, a finding felt to be indicative of
hepatic steatosis. There may be mild fatty sparing near the
gallbladder fossa. While no focal liver lesions are evident on this
study, it must be cautioned that the sensitivity of ultrasound for
detection of focal liver lesions is somewhat diminished in this
circumstance.

2.  Nonobstructing calculus in the upper pole of each kidney.

3. Portions of pancreas obscured by gas. Visualized portions of
pancreas appear normal.

4.  Aortic Atherosclerosis (LTYJW-84Y.Y).

5.  Study otherwise unremarkable.

## 2019-12-24 DIAGNOSIS — D649 Anemia, unspecified: Secondary | ICD-10-CM | POA: Diagnosis not present

## 2019-12-24 DIAGNOSIS — R5381 Other malaise: Secondary | ICD-10-CM | POA: Diagnosis not present

## 2019-12-24 DIAGNOSIS — E538 Deficiency of other specified B group vitamins: Secondary | ICD-10-CM | POA: Diagnosis not present

## 2019-12-24 DIAGNOSIS — E1142 Type 2 diabetes mellitus with diabetic polyneuropathy: Secondary | ICD-10-CM | POA: Diagnosis not present

## 2019-12-24 DIAGNOSIS — I10 Essential (primary) hypertension: Secondary | ICD-10-CM | POA: Diagnosis not present

## 2019-12-24 DIAGNOSIS — R5383 Other fatigue: Secondary | ICD-10-CM | POA: Diagnosis not present

## 2019-12-24 DIAGNOSIS — E291 Testicular hypofunction: Secondary | ICD-10-CM | POA: Diagnosis not present

## 2019-12-24 DIAGNOSIS — E6609 Other obesity due to excess calories: Secondary | ICD-10-CM | POA: Diagnosis not present

## 2019-12-24 DIAGNOSIS — Z8616 Personal history of COVID-19: Secondary | ICD-10-CM | POA: Diagnosis not present

## 2019-12-24 DIAGNOSIS — F330 Major depressive disorder, recurrent, mild: Secondary | ICD-10-CM | POA: Diagnosis not present

## 2019-12-24 DIAGNOSIS — M2550 Pain in unspecified joint: Secondary | ICD-10-CM | POA: Diagnosis not present

## 2019-12-24 DIAGNOSIS — F419 Anxiety disorder, unspecified: Secondary | ICD-10-CM | POA: Diagnosis not present

## 2020-01-01 DIAGNOSIS — R5383 Other fatigue: Secondary | ICD-10-CM | POA: Diagnosis not present

## 2020-01-01 DIAGNOSIS — R9389 Abnormal findings on diagnostic imaging of other specified body structures: Secondary | ICD-10-CM | POA: Diagnosis not present

## 2020-01-01 DIAGNOSIS — U071 COVID-19: Secondary | ICD-10-CM | POA: Diagnosis not present

## 2020-01-01 DIAGNOSIS — M2550 Pain in unspecified joint: Secondary | ICD-10-CM | POA: Diagnosis not present

## 2020-01-15 DIAGNOSIS — Z791 Long term (current) use of non-steroidal anti-inflammatories (NSAID): Secondary | ICD-10-CM | POA: Diagnosis not present

## 2020-01-15 DIAGNOSIS — D649 Anemia, unspecified: Secondary | ICD-10-CM | POA: Diagnosis not present

## 2020-01-22 DIAGNOSIS — D649 Anemia, unspecified: Secondary | ICD-10-CM | POA: Diagnosis not present

## 2020-01-22 DIAGNOSIS — Z01812 Encounter for preprocedural laboratory examination: Secondary | ICD-10-CM | POA: Diagnosis not present

## 2020-01-22 DIAGNOSIS — Z791 Long term (current) use of non-steroidal anti-inflammatories (NSAID): Secondary | ICD-10-CM | POA: Diagnosis not present

## 2020-01-22 DIAGNOSIS — Z20822 Contact with and (suspected) exposure to covid-19: Secondary | ICD-10-CM | POA: Diagnosis not present

## 2020-01-26 DIAGNOSIS — K259 Gastric ulcer, unspecified as acute or chronic, without hemorrhage or perforation: Secondary | ICD-10-CM | POA: Diagnosis not present

## 2020-01-26 DIAGNOSIS — E119 Type 2 diabetes mellitus without complications: Secondary | ICD-10-CM | POA: Diagnosis not present

## 2020-01-26 DIAGNOSIS — K3189 Other diseases of stomach and duodenum: Secondary | ICD-10-CM | POA: Diagnosis not present

## 2020-01-26 DIAGNOSIS — D6489 Other specified anemias: Secondary | ICD-10-CM | POA: Diagnosis not present

## 2020-01-26 DIAGNOSIS — D649 Anemia, unspecified: Secondary | ICD-10-CM | POA: Diagnosis not present

## 2020-01-26 HISTORY — PX: ESOPHAGOGASTRODUODENOSCOPY: SHX1529

## 2020-02-03 DIAGNOSIS — D649 Anemia, unspecified: Secondary | ICD-10-CM | POA: Diagnosis not present

## 2020-02-03 DIAGNOSIS — Z791 Long term (current) use of non-steroidal anti-inflammatories (NSAID): Secondary | ICD-10-CM | POA: Diagnosis not present

## 2020-02-04 DIAGNOSIS — Z794 Long term (current) use of insulin: Secondary | ICD-10-CM | POA: Diagnosis not present

## 2020-02-04 DIAGNOSIS — E1142 Type 2 diabetes mellitus with diabetic polyneuropathy: Secondary | ICD-10-CM | POA: Diagnosis not present

## 2020-02-04 DIAGNOSIS — D649 Anemia, unspecified: Secondary | ICD-10-CM | POA: Diagnosis not present

## 2020-02-04 DIAGNOSIS — Z8616 Personal history of COVID-19: Secondary | ICD-10-CM | POA: Diagnosis not present

## 2020-02-04 DIAGNOSIS — I10 Essential (primary) hypertension: Secondary | ICD-10-CM | POA: Diagnosis not present

## 2020-02-04 DIAGNOSIS — E291 Testicular hypofunction: Secondary | ICD-10-CM | POA: Diagnosis not present

## 2020-02-04 DIAGNOSIS — F330 Major depressive disorder, recurrent, mild: Secondary | ICD-10-CM | POA: Diagnosis not present

## 2020-02-18 DIAGNOSIS — E291 Testicular hypofunction: Secondary | ICD-10-CM | POA: Diagnosis not present

## 2020-03-03 DIAGNOSIS — E291 Testicular hypofunction: Secondary | ICD-10-CM | POA: Diagnosis not present

## 2020-03-17 DIAGNOSIS — E291 Testicular hypofunction: Secondary | ICD-10-CM | POA: Diagnosis not present

## 2020-03-22 DIAGNOSIS — E6609 Other obesity due to excess calories: Secondary | ICD-10-CM | POA: Diagnosis not present

## 2020-03-22 DIAGNOSIS — Z23 Encounter for immunization: Secondary | ICD-10-CM | POA: Diagnosis not present

## 2020-03-22 DIAGNOSIS — F330 Major depressive disorder, recurrent, mild: Secondary | ICD-10-CM | POA: Diagnosis not present

## 2020-03-22 DIAGNOSIS — F410 Panic disorder [episodic paroxysmal anxiety] without agoraphobia: Secondary | ICD-10-CM | POA: Diagnosis not present

## 2020-03-22 DIAGNOSIS — Z125 Encounter for screening for malignant neoplasm of prostate: Secondary | ICD-10-CM | POA: Diagnosis not present

## 2020-03-22 DIAGNOSIS — F419 Anxiety disorder, unspecified: Secondary | ICD-10-CM | POA: Diagnosis not present

## 2020-03-22 DIAGNOSIS — R609 Edema, unspecified: Secondary | ICD-10-CM | POA: Diagnosis not present

## 2020-03-22 DIAGNOSIS — E782 Mixed hyperlipidemia: Secondary | ICD-10-CM | POA: Diagnosis not present

## 2020-03-22 DIAGNOSIS — E1142 Type 2 diabetes mellitus with diabetic polyneuropathy: Secondary | ICD-10-CM | POA: Diagnosis not present

## 2020-03-22 DIAGNOSIS — I10 Essential (primary) hypertension: Secondary | ICD-10-CM | POA: Diagnosis not present

## 2020-03-22 DIAGNOSIS — E538 Deficiency of other specified B group vitamins: Secondary | ICD-10-CM | POA: Diagnosis not present

## 2020-03-22 DIAGNOSIS — Z794 Long term (current) use of insulin: Secondary | ICD-10-CM | POA: Diagnosis not present

## 2020-03-22 DIAGNOSIS — Z6831 Body mass index (BMI) 31.0-31.9, adult: Secondary | ICD-10-CM | POA: Diagnosis not present

## 2020-03-22 DIAGNOSIS — Z0000 Encounter for general adult medical examination without abnormal findings: Secondary | ICD-10-CM | POA: Diagnosis not present

## 2020-03-22 DIAGNOSIS — G2581 Restless legs syndrome: Secondary | ICD-10-CM | POA: Diagnosis not present

## 2020-03-31 DIAGNOSIS — E291 Testicular hypofunction: Secondary | ICD-10-CM | POA: Diagnosis not present

## 2020-04-14 DIAGNOSIS — E291 Testicular hypofunction: Secondary | ICD-10-CM | POA: Diagnosis not present

## 2020-04-19 DIAGNOSIS — D649 Anemia, unspecified: Secondary | ICD-10-CM | POA: Diagnosis not present

## 2020-04-19 DIAGNOSIS — K3189 Other diseases of stomach and duodenum: Secondary | ICD-10-CM | POA: Diagnosis not present

## 2020-04-28 DIAGNOSIS — E291 Testicular hypofunction: Secondary | ICD-10-CM | POA: Diagnosis not present

## 2020-05-09 DIAGNOSIS — N200 Calculus of kidney: Secondary | ICD-10-CM | POA: Diagnosis not present

## 2020-05-09 DIAGNOSIS — K259 Gastric ulcer, unspecified as acute or chronic, without hemorrhage or perforation: Secondary | ICD-10-CM | POA: Diagnosis not present

## 2020-05-09 DIAGNOSIS — I700 Atherosclerosis of aorta: Secondary | ICD-10-CM | POA: Diagnosis not present

## 2020-05-12 DIAGNOSIS — E291 Testicular hypofunction: Secondary | ICD-10-CM | POA: Diagnosis not present

## 2020-05-26 DIAGNOSIS — E291 Testicular hypofunction: Secondary | ICD-10-CM | POA: Diagnosis not present

## 2020-06-09 DIAGNOSIS — E291 Testicular hypofunction: Secondary | ICD-10-CM | POA: Diagnosis not present

## 2020-06-13 DIAGNOSIS — M5116 Intervertebral disc disorders with radiculopathy, lumbar region: Secondary | ICD-10-CM | POA: Diagnosis not present

## 2020-06-13 DIAGNOSIS — Z79891 Long term (current) use of opiate analgesic: Secondary | ICD-10-CM | POA: Diagnosis not present

## 2020-06-13 DIAGNOSIS — R413 Other amnesia: Secondary | ICD-10-CM | POA: Diagnosis not present

## 2020-06-20 DIAGNOSIS — Z23 Encounter for immunization: Secondary | ICD-10-CM | POA: Diagnosis not present

## 2020-06-20 DIAGNOSIS — Z794 Long term (current) use of insulin: Secondary | ICD-10-CM | POA: Diagnosis not present

## 2020-06-20 DIAGNOSIS — I10 Essential (primary) hypertension: Secondary | ICD-10-CM | POA: Diagnosis not present

## 2020-06-20 DIAGNOSIS — D649 Anemia, unspecified: Secondary | ICD-10-CM | POA: Diagnosis not present

## 2020-06-20 DIAGNOSIS — N529 Male erectile dysfunction, unspecified: Secondary | ICD-10-CM | POA: Diagnosis not present

## 2020-06-20 DIAGNOSIS — E1142 Type 2 diabetes mellitus with diabetic polyneuropathy: Secondary | ICD-10-CM | POA: Diagnosis not present

## 2020-06-23 DIAGNOSIS — E291 Testicular hypofunction: Secondary | ICD-10-CM | POA: Diagnosis not present

## 2020-07-07 DIAGNOSIS — E291 Testicular hypofunction: Secondary | ICD-10-CM | POA: Diagnosis not present

## 2020-07-25 DIAGNOSIS — N529 Male erectile dysfunction, unspecified: Secondary | ICD-10-CM | POA: Diagnosis not present

## 2020-07-25 DIAGNOSIS — I10 Essential (primary) hypertension: Secondary | ICD-10-CM | POA: Diagnosis not present

## 2020-07-25 DIAGNOSIS — E538 Deficiency of other specified B group vitamins: Secondary | ICD-10-CM | POA: Diagnosis not present

## 2020-07-25 DIAGNOSIS — E1142 Type 2 diabetes mellitus with diabetic polyneuropathy: Secondary | ICD-10-CM | POA: Diagnosis not present

## 2020-07-25 DIAGNOSIS — E291 Testicular hypofunction: Secondary | ICD-10-CM | POA: Diagnosis not present

## 2020-07-25 DIAGNOSIS — Z794 Long term (current) use of insulin: Secondary | ICD-10-CM | POA: Diagnosis not present

## 2020-07-25 DIAGNOSIS — D500 Iron deficiency anemia secondary to blood loss (chronic): Secondary | ICD-10-CM | POA: Diagnosis not present

## 2020-08-09 DIAGNOSIS — E291 Testicular hypofunction: Secondary | ICD-10-CM | POA: Diagnosis not present

## 2020-08-18 ENCOUNTER — Telehealth: Payer: Self-pay | Admitting: Gastroenterology

## 2020-08-18 NOTE — Telephone Encounter (Signed)
No problems Can make appt in APP clinic (routine) RG

## 2020-08-18 NOTE — Telephone Encounter (Signed)
Hi Dr. Lyndel Safe, pt's wife Chely called looking to make an appt with you for pt. Pt recently saw a GI at [facility]. Pt's wife stated that pt was referred there by his PCP but they were not aware that they transferring care and that was not their intention. She stated that her husband really "likes you and want to continue his care with you." Records from [facility] are in [place]. Could you pls review themm and advise if it is ok to schedule patient for an appt with you? Thank you.

## 2020-08-19 NOTE — Telephone Encounter (Signed)
Left message to call back to schedule ov with Dr. Lyndel Safe.

## 2020-08-23 DIAGNOSIS — E291 Testicular hypofunction: Secondary | ICD-10-CM | POA: Diagnosis not present

## 2020-09-06 DIAGNOSIS — E291 Testicular hypofunction: Secondary | ICD-10-CM | POA: Diagnosis not present

## 2020-09-16 ENCOUNTER — Ambulatory Visit: Payer: Self-pay | Admitting: Gastroenterology

## 2020-09-16 ENCOUNTER — Other Ambulatory Visit: Payer: Self-pay

## 2020-09-16 ENCOUNTER — Other Ambulatory Visit (INDEPENDENT_AMBULATORY_CARE_PROVIDER_SITE_OTHER): Payer: PPO

## 2020-09-16 ENCOUNTER — Encounter: Payer: Self-pay | Admitting: Gastroenterology

## 2020-09-16 VITALS — BP 128/78 | HR 65 | Ht 70.0 in | Wt 226.1 lb

## 2020-09-16 DIAGNOSIS — R197 Diarrhea, unspecified: Secondary | ICD-10-CM | POA: Diagnosis not present

## 2020-09-16 LAB — CBC WITH DIFFERENTIAL/PLATELET
Basophils Absolute: 0 10*3/uL (ref 0.0–0.1)
Basophils Relative: 0.4 % (ref 0.0–3.0)
Eosinophils Absolute: 0.1 10*3/uL (ref 0.0–0.7)
Eosinophils Relative: 1.1 % (ref 0.0–5.0)
HCT: 43.2 % (ref 39.0–52.0)
Hemoglobin: 14.5 g/dL (ref 13.0–17.0)
Lymphocytes Relative: 33.3 % (ref 12.0–46.0)
Lymphs Abs: 1.9 10*3/uL (ref 0.7–4.0)
MCHC: 33.6 g/dL (ref 30.0–36.0)
MCV: 91.5 fl (ref 78.0–100.0)
Monocytes Absolute: 0.4 10*3/uL (ref 0.1–1.0)
Monocytes Relative: 6.4 % (ref 3.0–12.0)
Neutro Abs: 3.3 10*3/uL (ref 1.4–7.7)
Neutrophils Relative %: 58.8 % (ref 43.0–77.0)
Platelets: 169 10*3/uL (ref 150.0–400.0)
RBC: 4.72 Mil/uL (ref 4.22–5.81)
RDW: 14.1 % (ref 11.5–15.5)
WBC: 5.6 10*3/uL (ref 4.0–10.5)

## 2020-09-16 LAB — COMPREHENSIVE METABOLIC PANEL
ALT: 18 U/L (ref 0–53)
AST: 19 U/L (ref 0–37)
Albumin: 4.3 g/dL (ref 3.5–5.2)
Alkaline Phosphatase: 61 U/L (ref 39–117)
BUN: 10 mg/dL (ref 6–23)
CO2: 36 mEq/L — ABNORMAL HIGH (ref 19–32)
Calcium: 9.2 mg/dL (ref 8.4–10.5)
Chloride: 96 mEq/L (ref 96–112)
Creatinine, Ser: 1.24 mg/dL (ref 0.40–1.50)
GFR: 61.95 mL/min (ref >60.00)
Glucose, Bld: 109 mg/dL — ABNORMAL HIGH (ref 70–99)
Potassium: 4.1 mEq/L (ref 3.5–5.1)
Sodium: 138 mEq/L (ref 135–145)
Total Bilirubin: 0.9 mg/dL (ref 0.2–1.2)
Total Protein: 7 g/dL (ref 6.0–8.3)

## 2020-09-16 LAB — C-REACTIVE PROTEIN: CRP: <1 mg/dL (ref 0.5–20.0)

## 2020-09-16 NOTE — Addendum Note (Signed)
Addended by: Manuela Schwartz on: 09/16/2020 11:09 AM   Modules accepted: Orders

## 2020-09-16 NOTE — Progress Notes (Signed)
Chief Complaint: for colon/liver  Referring Provider:  Dr Bea Graff      ASSESSMENT AND PLAN;   #1. IDA with FOBT neg (resolved). Neg EGD with SB bx, VCE, colon, CT AP July /2021  #2.  Fatty liver with Nl LFTs. Liver Bx-12/2013 Gd1/Stage 0 steatohepatitis without fibrosis. Has assoc DM2, obesity, HLD/hypertriglyceridemia.  #3.  Intermittent diarrhea likely d/t metformin. H/O constipation in the past.  Plan; -Stool studies for GI Pathogen (includes C. Diff), fecal elastase, fat and Calprotectin. -CBC, CMP, celiac screen CRP. -Continue Pepcid 20 mg p.o. twice daily. -Start exercising. Reduce wt.  -Call if any problems. -If he has recurrent IDA, consider hematology consultation. -If he has heme positive stools in the future, would consider push enteroscopy followed by single/double balloon enteroscopy.    HPI:    Rodney Harper is a 64 y.o. male  With COVID-19 01/2020, HTN, DM2 with polyneuropathy, vitamin B12 deficiency, erectile dysfunction, HLD, chronic low back pain on oxycodone, fatty liver without liver cirrhosis, anxiety/depression  With IDA which has resolved. Hb 11.8 (12/2019) to 13.7 (03/2020) to 14.6 (06/2020). His BUN/Cr 12/1.28.  Iron studies showed ferritin 21, TIBC 393, iron 65  He underwent EGD with SB bx, CT Abdo/pelvis, followed by VCE which was unremarkable as below.  He does have B12 deficiency and is on B12 supplements.  He does give H/O intermittent diarrhea without nocturnal symptoms.  He used to be more constipated.  Of note that he has been on Metformin.  No melena or hematochezia.  He does have lower abdominal pain which gets better with defecation.  No nausea, vomiting, heartburn, regurgitation, odynophagia or dysphagia.  There is no melena or hematochezia. No unintentional weight loss.  No history of itching, skin lesions, easy bruisability, intake of over-the-counter medications including diet pills, herbal medications, anabolic steroids or Tylenol.   There is no history of blood transfusions, IV drug use or family history of liver disease.  No jaundice dark urine or pale stools.    H/O alcohol abuse in remote past.  No alcohol intake over last 8 years.  He has gained weight.  Wt Readings from Last 3 Encounters:  09/16/20 226 lb 2 oz (102.6 kg)  04/21/19 215 lb (97.5 kg)  04/10/19 215 lb (97.5 kg)     Past GI procedures:  Colonoscopy 04/21/2019 -Mild sigmoid diverticulosis. -Small internal hemorrhoids. -Otherwise normal colonoscopy. -Repeat in 10 years.  Earlier, if with any new problems.  EGD 01/26/2020 (Dr Shana Chute) -Multiple 1 to 3 mm erosions in the prepyloric stomach and gastric antrum.  Otherwise normal EGD. -Neg SB bx for celiac, negative gastric biopsies for HP  VCE 04/19/2020 [facility] No explanation of anemia. Minimal erosive changes seen in small bowel and stomach. Fair preparation. No definite AVMs, ulcers, polyps or masses seen.  CT Abdo/pelvis with contrast (enterography) 05/09/2020 -Chronic hypertrophy of submucosal fat throughout the colon. ?  Chronic colitis such as ulcerative colitis.  Unlikely to be Crohn's. -No radiologic evidence of active inflammatory bowel disease. -Bilateral nephrolithiasis. -Aortic atherosclerosis Past Medical History:  Diagnosis Date  . Alcohol abuse - H/O   . Anxiety   . Benign essential HTN   . Benign prostatic hyperplasia without lower urinary tract symptoms   . Chronic edema       . Hypogonadism in male   . Lumbar disc herniation with radiculopathy   . Mild episode of recurrent major depressive disorder ([place])   . Mixed hyperlipidemia   . Pain disorder   .  Primary insomnia   . Type 2 diabetes mellitus with diabetic polyneuropathy, with long-term current use of insulin ([place])   . Vitamin B12 deficiency   . Vitamin D deficiency     Past Surgical History:  Procedure Laterality Date  . APPENDECTOMY    . BACK SURGERY    . COLONOSCOPY  05/07/2012   Mild sigmoid  diverticulosis. Small internal hemorrhoids. OTherwise normal colonoscopy.   . ESOPHAGOGASTRODUODENOSCOPY  01/26/2020   [place]    Family History  Problem Relation Age of Onset  . Throat cancer Mother   . Heart disease Father   . Heart attack Father   . Hypertension Father   . Colon cancer Neg Hx   . Rectal cancer Neg Hx     Social History   Tobacco Use  . Smoking status: Never Smoker  . Smokeless tobacco: Never Used  Vaping Use  . Vaping Use: Never used  Substance Use Topics  . Alcohol use: Not Currently  . Drug use: Not Currently    Current Outpatient Medications  Medication Sig Dispense Refill  . ALPRAZolam (XANAX) 0.5 MG tablet Take 0.5 mg by mouth every 8 (eight) hours as needed.    . baclofen (LIORESAL) 10 MG tablet Take 10 mg by mouth 3 (three) times daily.    . Cholecalciferol (VITAMIN D-1000 MAX ST) 25 MCG (1000 UT) tablet Take 1,000 Units by mouth daily.    Marland Kitchen escitalopram (LEXAPRO) 20 MG tablet Take 20 mg by mouth daily.    Marland Kitchen ezetimibe (ZETIA) 10 MG tablet Take 10 mg by mouth at bedtime.    . famotidine (PEPCID) 20 MG tablet Take 20 mg by mouth 2 (two) times daily.    . furosemide (LASIX) 20 MG tablet Take 20 mg by mouth daily.    Marland Kitchen losartan-hydrochlorothiazide (HYZAAR) 50-12.5 MG tablet Take 1 tablet by mouth daily.    . metFORMIN (GLUCOPHAGE-XR) 500 MG 24 hr tablet Take 500 mg by mouth 2 (two) times a day.    . metoprolol succinate (TOPROL-XL) 100 MG 24 hr tablet Take 100 mg by mouth daily.    . mupirocin ointment (BACTROBAN) 2 % Apply topically 2 (two) times daily as needed.    Marland Kitchen oxyCODONE-acetaminophen (PERCOCET) 10-325 MG tablet Take 1 tablet by mouth every 6 (six) hours as needed.    . potassium chloride (KLOR-CON) 10 MEQ tablet Take 10 mEq by mouth daily.    . pregabalin (LYRICA) 75 MG capsule Take 75 mg by mouth daily.    Marland Kitchen rOPINIRole (REQUIP) 0.25 MG tablet Take 1-2 tablets by mouth at bedtime as needed.    . tadalafil (CIALIS) 5 MG  tablet 1 tablet as needed.    . terazosin (HYTRIN) 2 MG capsule Take 2 mg by mouth at bedtime.    Marland Kitchen testosterone cypionate (DEPOTESTOSTERONE CYPIONATE) 200 MG/ML injection Inject 200 mg into the muscle every 14 (fourteen) days.    . vitamin B-12 (CYANOCOBALAMIN) 1000 MCG tablet Take 1,000 mcg by mouth daily.    Marland Kitchen glipiZIDE (GLUCOTROL XL) 5 MG 24 hr tablet Take 5 mg by mouth daily. (Patient not taking: Reported on 09/16/2020)     No current facility-administered medications for this visit.    Allergies  Allergen Reactions  . Carbamazepine Other (See Comments)    Unknown  . Clonidine Rash    Review of Systems:  Constitutional: Denies fever, chills, diaphoresis, appetite change and fatigue.  HEENT: Denies photophobia, eye pain, redness, hearing loss, ear pain, congestion, sore throat,  rhinorrhea, sneezing, mouth sores, neck pain, neck stiffness and tinnitus.   Respiratory: Denies SOB, DOE, cough, chest tightness,  and wheezing.   Cardiovascular: Denies chest pain, palpitations and leg swelling.  Genitourinary: Denies dysuria, urgency, frequency, hematuria, flank pain and difficulty urinating.  Musculoskeletal: Has myalgias, back pain, joint swelling, arthralgias and gait problem.  Skin: No rash.  Neurological: Denies dizziness, seizures, syncope, weakness, light-headedness, numbness and headaches.  Hematological: Denies adenopathy. Easy bruising, personal or family bleeding history  Psychiatric/Behavioral: Has anxiety or depression     Physical Exam:    BP 128/78   Pulse 65   Ht 5\' 10"  (1.778 m)   Wt 226 lb 2 oz (102.6 kg)   BMI 32.45 kg/m  Filed Weights   09/16/20 0934  Weight: 226 lb 2 oz (102.6 kg)   Constitutional:  Well-developed, in no acute distress. Psychiatric: Normal mood and affect. Behavior is normal.   Data Reviewed: I have personally reviewed following labs and imaging studies Labs   This service was provided via Doxy video visit.  The patient was located  at home.  The provider was located in office.  The patient did consent to this telephone visit and is aware of possible charges through their insurance for this visit.  The patient was referred by Dr. Bea Graff.  The other persons participating in this telemedicine service were patient's wife and their role was assistance (she was having a rough morning with migraine headaches)  Time spent on call/review of records/coordination of care: 45 min     Carmell Austria, MD 09/16/2020, 9:49 AM  Cc: Dr Bea Graff.

## 2020-09-16 NOTE — Patient Instructions (Signed)
If you are age 63 or older, your body mass index should be between 23-30. Your Body mass index is 32.45 kg/m. If this is out of the aforementioned range listed, please consider follow up with your Primary Care Provider.  If you are age 29 or younger, your body mass index should be between 19-25. Your Body mass index is 32.45 kg/m. If this is out of the aformentioned range listed, please consider follow up with your Primary Care Provider.   Please go to the second floor, [facility] [address] and schedule your labwork today.  Start exercising to reduce your weight. Call our office if you have any problems  Due to recent changes in healthcare laws, you may see the results of your imaging and laboratory studies on MyChart before your provider has had a chance to review them.  We understand that in some cases there may be results that are confusing or concerning to you. Not all laboratory results come back in the same time frame and the provider may be waiting for multiple results in order to interpret others.  Please give [place] 48 hours in order for your provider to thoroughly review all the results before contacting the office for clarification of your results.   Thank you for choosing me and [facility].  Dr Lyndel Safe

## 2020-09-20 LAB — CELIAC PANEL 10
Antigliadin Abs, IgA: 4 units (ref 0–19)
Endomysial IgA: NEGATIVE
Gliadin IgG: 1 units (ref 0–19)
IgA/Immunoglobulin A, Serum: 243 mg/dL (ref 61–437)
Tissue Transglut Ab: <2 U/mL (ref 0–5)
Transglutaminase IgA: <2 U/mL (ref 0–3)

## 2020-09-21 DIAGNOSIS — E291 Testicular hypofunction: Secondary | ICD-10-CM | POA: Diagnosis not present

## 2020-10-04 DIAGNOSIS — E291 Testicular hypofunction: Secondary | ICD-10-CM | POA: Diagnosis not present

## 2020-10-19 DIAGNOSIS — E291 Testicular hypofunction: Secondary | ICD-10-CM | POA: Diagnosis not present

## 2020-11-01 DIAGNOSIS — E291 Testicular hypofunction: Secondary | ICD-10-CM | POA: Diagnosis not present

## 2020-11-07 DIAGNOSIS — Z79899 Other long term (current) drug therapy: Secondary | ICD-10-CM | POA: Diagnosis not present

## 2020-11-07 DIAGNOSIS — M545 Low back pain, unspecified: Secondary | ICD-10-CM | POA: Diagnosis not present

## 2020-11-07 DIAGNOSIS — M5116 Intervertebral disc disorders with radiculopathy, lumbar region: Secondary | ICD-10-CM | POA: Diagnosis not present

## 2020-11-15 DIAGNOSIS — E291 Testicular hypofunction: Secondary | ICD-10-CM | POA: Diagnosis not present

## 2020-11-20 DIAGNOSIS — M5116 Intervertebral disc disorders with radiculopathy, lumbar region: Secondary | ICD-10-CM | POA: Diagnosis not present

## 2020-11-20 DIAGNOSIS — M545 Low back pain, unspecified: Secondary | ICD-10-CM | POA: Diagnosis not present

## 2020-11-29 DIAGNOSIS — M5416 Radiculopathy, lumbar region: Secondary | ICD-10-CM | POA: Diagnosis not present

## 2020-11-29 DIAGNOSIS — M48061 Spinal stenosis, lumbar region without neurogenic claudication: Secondary | ICD-10-CM | POA: Diagnosis not present

## 2020-11-29 DIAGNOSIS — E291 Testicular hypofunction: Secondary | ICD-10-CM | POA: Diagnosis not present

## 2020-12-02 ENCOUNTER — Other Ambulatory Visit: Payer: Self-pay | Admitting: Neurosurgery

## 2020-12-06 DIAGNOSIS — E6609 Other obesity due to excess calories: Secondary | ICD-10-CM | POA: Diagnosis not present

## 2020-12-06 DIAGNOSIS — E538 Deficiency of other specified B group vitamins: Secondary | ICD-10-CM | POA: Diagnosis not present

## 2020-12-06 DIAGNOSIS — D500 Iron deficiency anemia secondary to blood loss (chronic): Secondary | ICD-10-CM | POA: Diagnosis not present

## 2020-12-06 DIAGNOSIS — E782 Mixed hyperlipidemia: Secondary | ICD-10-CM | POA: Diagnosis not present

## 2020-12-06 DIAGNOSIS — E291 Testicular hypofunction: Secondary | ICD-10-CM | POA: Diagnosis not present

## 2020-12-06 DIAGNOSIS — E1142 Type 2 diabetes mellitus with diabetic polyneuropathy: Secondary | ICD-10-CM | POA: Diagnosis not present

## 2020-12-06 DIAGNOSIS — Z794 Long term (current) use of insulin: Secondary | ICD-10-CM | POA: Diagnosis not present

## 2020-12-06 DIAGNOSIS — Z6831 Body mass index (BMI) 31.0-31.9, adult: Secondary | ICD-10-CM | POA: Diagnosis not present

## 2020-12-06 DIAGNOSIS — I10 Essential (primary) hypertension: Secondary | ICD-10-CM | POA: Diagnosis not present

## 2020-12-08 NOTE — Pre-Procedure Instructions (Signed)
Rodney Harper  12/08/2020    Your procedure is scheduled on Tues., December 13, 2020 from 8:00AM-11:49AM  Report to [facility] Entrance "A" at 6:00AM  Call this number if you have problems the morning of surgery:  [phone]   Remember:  Do not eat or drink after midnight on March 14th    Take these medicines the morning of surgery with A SIP OF WATER: baclofen (LIORESAL)  famotidine (PEPCID) pregabalin (LYRICA) terazosin (HYTRIN)  If Needed: Acetaminophen (TYLENOL)- for mild pain OxyCODONE-acetaminophen (PERCOCET)- for moderate to severe pain Naphazoline-Glycerin-Zinc Sulf (CLEAR EYES COOLING COMFORT)   As of today, STOP taking all Aspirin (unless instructed by your doctor) and Other Aspirin containing products, Vitamins, Fish oils, and Herbal medications. Also stop all NSAIDS i.e. Advil, Ibuprofen, Motrin, Aleve, Anaprox, Naproxen, BC, Goody Powders, and all Supplements.   WHAT DO I DO ABOUT MY DIABETES MEDICATION?  Marland Kitchen Do not take MetFORMIN (GLUCOPHAGE-XR) the morning of surgery.   How to Manage Your Diabetes Before and After SurgerY  o Check your blood sugar the morning of your surgery when you wake up and every 2 hours until you get to the Short Stay unit. . If your blood sugar is less than 70 mg/dL, you will need to treat for low blood sugar: o Do not take insulin. o Treat a low blood sugar (less than 70 mg/dL) with  cup of clear juice (cranberry or apple), 4 glucose tablets, OR glucose gel. Recheck blood sugar in 15 minutes after treatment (to make sure it is greater than 70 mg/dL). If your blood sugar is not greater than 70 mg/dL on recheck, call [phone] o  for further instructions. o  If your CBG is greater than 220 mg/dL, inform the staff upon arrival to Short Stay.  Reviewed and Endorsed by [facility] Patient Education Committee, August 2015   No Smoking of any kind, Tobacco/Vaping, or Alcohol products 24 hours prior to your procedure. If you  use a Cpap at night, you may bring all equipment for your overnight stay.    Day of Surgery:  Do not wear jewelry.  Do not wear lotions, powders, colognes, or deodorant.  Do not shave 48 hours prior to surgery.  Men may shave face and neck.  Do not bring valuables to the hospital.  [facility] is not responsible for any belongings or valuables.  Contacts, dentures or bridgework may not be worn into surgery.   For patients admitted to the hospital, discharge time will be determined by your treatment team.  Patients discharged the day of surgery will not be allowed to drive home, and someone age 2 and over needs to stay with them for 24 hours.   Special instructions:  [facility]- Preparing For Surgery  Before surgery, you can play an important role. Because skin is not sterile, your skin needs to be as free of germs as possible. You can reduce the number of germs on your skin by washing with CHG (chlorahexidine gluconate) Soap before surgery.  CHG is an antiseptic cleaner which kills germs and bonds with the skin to continue killing germs even after washing.    Oral Hygiene is also important to reduce your risk of infection.  Remember - BRUSH YOUR TEETH THE MORNING OF SURGERY WITH YOUR REGULAR TOOTHPASTE  Please do not use if you have an allergy to CHG or antibacterial soaps. If your skin becomes reddened/irritated stop using the CHG.  Do not shave (including legs and underarms)  for at least 48 hours prior to first CHG shower. It is OK to shave your face.  Please follow these instructions carefully.   1. Shower the NIGHT BEFORE SURGERY and the MORNING OF SURGERY with CHG.   2. If you chose to wash your hair, wash your hair first as usual with your normal shampoo.  3. After you shampoo, rinse your hair and body thoroughly to remove the shampoo.  4. Use CHG as you would any other liquid soap. You can apply CHG directly to the skin and wash gently with a scrungie or a clean washcloth.    5. Apply the CHG Soap to your body ONLY FROM THE NECK DOWN.  Do not use on open wounds or open sores. Avoid contact with your eyes, ears, mouth and genitals (private parts). Wash Face and genitals (private parts)  with your normal soap.  6. Wash thoroughly, paying special attention to the area where your surgery will be performed.  7. Thoroughly rinse your body with warm water from the neck down.  8. DO NOT shower/wash with your normal soap after using and rinsing off the CHG Soap.  9. Pat yourself dry with a CLEAN TOWEL.  10. Wear CLEAN PAJAMAS to bed the night before surgery, wear comfortable clothes the morning of surgery  11. Place CLEAN SHEETS on your bed the night of your first shower and DO NOT SLEEP WITH PETS.  Reminders: Do not apply any deodorants/lotions.  Please wear clean clothes to the hospital/surgery center.   Remember to brush your teeth WITH YOUR REGULAR TOOTHPASTE.  Please read over the following fact sheets that you were given.

## 2020-12-09 ENCOUNTER — Other Ambulatory Visit (HOSPITAL_COMMUNITY)
Admission: RE | Admit: 2020-12-09 | Discharge: 2020-12-09 | Disposition: A | Discharge status: Home / Self Care | Payer: PPO | Source: Ambulatory Visit | Attending: Neurosurgery | Admitting: Neurosurgery

## 2020-12-09 ENCOUNTER — Encounter (HOSPITAL_COMMUNITY): Payer: Self-pay

## 2020-12-09 ENCOUNTER — Encounter (HOSPITAL_COMMUNITY)
Admission: RE | Admit: 2020-12-09 | Discharge: 2020-12-09 | Disposition: A | Discharge status: Home / Self Care | Payer: PPO | Source: Ambulatory Visit | Attending: Neurosurgery | Admitting: Neurosurgery

## 2020-12-09 ENCOUNTER — Other Ambulatory Visit: Payer: Self-pay

## 2020-12-09 DIAGNOSIS — Z01818 Encounter for other preprocedural examination: Secondary | ICD-10-CM | POA: Diagnosis not present

## 2020-12-09 DIAGNOSIS — Z20822 Contact with and (suspected) exposure to covid-19: Secondary | ICD-10-CM | POA: Insufficient documentation

## 2020-12-09 DIAGNOSIS — Z01812 Encounter for preprocedural laboratory examination: Secondary | ICD-10-CM | POA: Insufficient documentation

## 2020-12-09 DIAGNOSIS — R9431 Abnormal electrocardiogram [ECG] [EKG]: Secondary | ICD-10-CM | POA: Diagnosis not present

## 2020-12-09 DIAGNOSIS — K7581 Nonalcoholic steatohepatitis (NASH): Secondary | ICD-10-CM | POA: Insufficient documentation

## 2020-12-09 DIAGNOSIS — M48062 Spinal stenosis, lumbar region with neurogenic claudication: Secondary | ICD-10-CM | POA: Diagnosis not present

## 2020-12-09 HISTORY — DX: Personal history of urinary calculi: Z87.442

## 2020-12-09 HISTORY — DX: Anemia, unspecified: D64.9

## 2020-12-09 HISTORY — DX: Unspecified convulsions: R56.9

## 2020-12-09 HISTORY — DX: Pneumonia, unspecified organism: J18.9

## 2020-12-09 HISTORY — DX: COVID-19: U07.1

## 2020-12-09 LAB — CBC WITH DIFFERENTIAL/PLATELET
Abs Immature Granulocytes: 0.01 10*3/uL (ref 0.00–0.07)
Basophils Absolute: 0 10*3/uL (ref 0.0–0.1)
Basophils Relative: 1 %
Eosinophils Absolute: 0.1 10*3/uL (ref 0.0–0.5)
Eosinophils Relative: 2 %
HCT: 44.3 % (ref 39.0–52.0)
Hemoglobin: 15 g/dL (ref 13.0–17.0)
Immature Granulocytes: 0 %
Lymphocytes Relative: 41 %
Lymphs Abs: 2.4 10*3/uL (ref 0.7–4.0)
MCH: 31.6 pg (ref 26.0–34.0)
MCHC: 33.9 g/dL (ref 30.0–36.0)
MCV: 93.5 fL (ref 80.0–100.0)
Monocytes Absolute: 0.4 10*3/uL (ref 0.1–1.0)
Monocytes Relative: 7 %
Neutro Abs: 2.9 10*3/uL (ref 1.7–7.7)
Neutrophils Relative %: 49 %
Platelets: 158 10*3/uL (ref 150–400)
RBC: 4.74 MIL/uL (ref 4.22–5.81)
RDW: 13.7 % (ref 11.5–15.5)
WBC: 5.8 10*3/uL (ref 4.0–10.5)
nRBC: 0 % (ref 0.0–0.2)

## 2020-12-09 LAB — TYPE AND SCREEN
ABO/RH(D): O POS
Antibody Screen: NEGATIVE

## 2020-12-09 LAB — BASIC METABOLIC PANEL
Anion gap: 7 (ref 5–15)
BUN: 11 mg/dL (ref 8–23)
CO2: 38 mmol/L — ABNORMAL HIGH (ref 22–32)
Calcium: 9.3 mg/dL (ref 8.9–10.3)
Chloride: 94 mmol/L — ABNORMAL LOW (ref 98–111)
Creatinine, Ser: 1.32 mg/dL — ABNORMAL HIGH (ref 0.61–1.24)
GFR, Estimated: >60 mL/min (ref >60)
Glucose, Bld: 114 mg/dL — ABNORMAL HIGH (ref 70–99)
Potassium: 3.7 mmol/L (ref 3.5–5.1)
Sodium: 139 mmol/L (ref 135–145)

## 2020-12-09 LAB — SURGICAL PCR SCREEN
MRSA, PCR: NEGATIVE
Staphylococcus aureus: NEGATIVE

## 2020-12-09 LAB — SARS CORONAVIRUS 2 (TAT 6-24 HRS): SARS Coronavirus 2: NEGATIVE

## 2020-12-09 LAB — GLUCOSE, CAPILLARY: Glucose-Capillary: 132 mg/dL — ABNORMAL HIGH (ref 70–99)

## 2020-12-09 NOTE — Progress Notes (Signed)
PCP - Dr. Bea Graff  Cardiologist - Denies  Chest x-ray - Denies  EKG - 12/09/20  Stress Test - Denies  ECHO - Denies  Cardiac Cath - Denies  AICD-na PM-na LOOP-na  Sleep Study - Denies CPAP - Denies  LABS- 12/09/20: CBC w/D, BMP, T/S, PCR, COVID  ASA- Denies  ERAS- No  HA1C- 12/06/20 (CE): 6.1 Fasting Blood Sugar - 100-180, today 132 Checks Blood Sugar ___1__ time a week  Anesthesia- Yes-EKG  Pt denies having chest pain, sob, or fever at this time. All instructions explained to the pt, with a verbal understanding of the material. Pt agrees to go over the instructions while at home for a better understanding. Pt also instructed to self quarantine after being tested for COVID-19. The opportunity to ask questions was provided.   Coronavirus Screening  Have you experienced the following symptoms:  Cough yes/no: No Fever (>100.36F)  yes/no: No Runny nose yes/no: No Sore throat yes/no: No Difficulty breathing/shortness of breath  yes/no: No  Have you or a family member traveled in the last 14 days and where? yes/no: No   If the patient indicates "YES" to the above questions, their PAT will be rescheduled to limit the exposure to others and, the surgeon will be notified. THE PATIENT WILL NEED TO BE ASYMPTOMATIC FOR 14 DAYS.   If the patient is not experiencing any of these symptoms, the PAT nurse will instruct them to NOT bring anyone with them to their appointment since they may have these symptoms or traveled as well.   Please remind your patients and families that hospital visitation restrictions are in effect and the importance of the restrictions.

## 2020-12-12 NOTE — Progress Notes (Signed)
Anesthesia Chart Review:  History of abnormal EKG with inferior Q waves.  This has been noted since at least 2018.  In October 2018 the patient was referred to cardiology for preop evaluation given history of abnormal EKG.  He was seen by Dr. Donnetta Hutching at [facility] who recommended preop echocardiogram and if her results were normal he can proceed with surgery (ORIF of the left ankle).  Echo 07/30/2017 showed EF 65 to 70%, normal wall motion, no significant valvular disease.  History of fatty liver with normal LFTs, liver biopsy 2015 showed steatohepatitis without fibrosis.  Preop labs reviewed.Marland Kitchen  DM2 well-controlled with A1c 6.1 on 12/07/2018 0 in [place].  EKG 12/09/2020: Sinus bradycardia.  Rate 57. Possible Inferior infarct , age undetermined. Left ventricular hypertrophy  TTE 07/30/2017 (copy on chart): Conclusions: 1.  Patient had only on back. 2.  Normal left ventricular size and systolic function. 3.  EF 65-70%. 4.  No segmental wall motion abnormality seen. 5.  No significant valve disease seen.   [name] [facility]/Anesthesiology Phone [phone] 12/12/2020 2:01 PM

## 2020-12-12 NOTE — Anesthesia Preprocedure Evaluation (Addendum)
Anesthesia Evaluation  Patient identified by MRN, date of birth, ID band Patient awake    Reviewed: Allergy & Precautions, NPO status , Patient's Chart, lab work & pertinent test results  Airway Mallampati: II  TM Distance: >3 FB     Dental   Pulmonary pneumonia, former smoker,    breath sounds clear to auscultation       Cardiovascular hypertension,  Rhythm:Regular Rate:Normal     Neuro/Psych  Headaches, Seizures -,     GI/Hepatic negative GI ROS, Neg liver ROS,   Endo/Other  diabetes  Renal/GU      Musculoskeletal   Abdominal   Peds  Hematology  (+) anemia ,   Anesthesia Other Findings   Reproductive/Obstetrics                            Anesthesia Physical Anesthesia Plan  ASA: III  Anesthesia Plan: General   Post-op Pain Management:    Induction:   PONV Risk Score and Plan: 2 and Ondansetron, Dexamethasone and Midazolam  Airway Management Planned: Oral ETT  Additional Equipment:   Intra-op Plan:   Post-operative Plan: Extubation in OR  Informed Consent: I have reviewed the patients History and Physical, chart, labs and discussed the procedure including the risks, benefits and alternatives for the proposed anesthesia with the patient or authorized representative who has indicated his/her understanding and acceptance.     Dental advisory given  Plan Discussed with: CRNA and Anesthesiologist  Anesthesia Plan Comments: (PAT note by Karoline Caldwell, PA-C: History of abnormal EKG with inferior Q waves.  This has been noted since at least 2018.  In October 2018 the patient was referred to cardiology for preop evaluation given history of abnormal EKG.  He was seen by Dr. Donnetta Hutching at [facility] who recommended preop echocardiogram and if her results were normal he can proceed with surgery (ORIF of the left ankle).  Echo 07/30/2017 showed EF 65 to 70%, normal wall motion, no significant  valvular disease.  History of fatty liver with normal LFTs, liver biopsy 2015 showed steatohepatitis without fibrosis.  Preop labs reviewed.Marland Kitchen  DM2 well-controlled with A1c 6.1 on 12/07/2018 0 in [place].  EKG 12/09/2020: Sinus bradycardia.  Rate 57. Possible Inferior infarct , age undetermined. Left ventricular hypertrophy  TTE 07/30/2017 (copy on chart): Conclusions: 1.  Patient had only on back. 2.  Normal left ventricular size and systolic function. 3.  EF 65-70%. 4.  No segmental wall motion abnormality seen. 5.  No significant valve disease seen. )       Anesthesia Quick Evaluation

## 2020-12-13 ENCOUNTER — Encounter (HOSPITAL_COMMUNITY): Payer: Self-pay | Admitting: Neurosurgery

## 2020-12-13 ENCOUNTER — Inpatient Hospital Stay (HOSPITAL_COMMUNITY): Payer: PPO | Admitting: Anesthesiology

## 2020-12-13 ENCOUNTER — Inpatient Hospital Stay (HOSPITAL_COMMUNITY): Payer: PPO | Admitting: Physician Assistant

## 2020-12-13 ENCOUNTER — Other Ambulatory Visit: Payer: Self-pay

## 2020-12-13 ENCOUNTER — Inpatient Hospital Stay (HOSPITAL_COMMUNITY): Payer: PPO

## 2020-12-13 ENCOUNTER — Inpatient Hospital Stay (HOSPITAL_COMMUNITY)
Admission: RE | Disposition: A | Discharge status: Home / Self Care | Payer: Self-pay | Source: Home / Self Care | Attending: Neurosurgery

## 2020-12-13 ENCOUNTER — Inpatient Hospital Stay (HOSPITAL_COMMUNITY)
Admission: RE | Admit: 2020-12-13 | Discharge: 2020-12-15 | DRG: 455 | Disposition: A | Discharge status: Home / Self Care | Payer: PPO | Attending: Neurosurgery | Admitting: Neurosurgery

## 2020-12-13 DIAGNOSIS — M48061 Spinal stenosis, lumbar region without neurogenic claudication: Secondary | ICD-10-CM | POA: Diagnosis not present

## 2020-12-13 DIAGNOSIS — R569 Unspecified convulsions: Secondary | ICD-10-CM | POA: Diagnosis not present

## 2020-12-13 DIAGNOSIS — F419 Anxiety disorder, unspecified: Secondary | ICD-10-CM | POA: Diagnosis present

## 2020-12-13 DIAGNOSIS — Z794 Long term (current) use of insulin: Secondary | ICD-10-CM | POA: Diagnosis not present

## 2020-12-13 DIAGNOSIS — M5416 Radiculopathy, lumbar region: Secondary | ICD-10-CM | POA: Diagnosis not present

## 2020-12-13 DIAGNOSIS — E1142 Type 2 diabetes mellitus with diabetic polyneuropathy: Secondary | ICD-10-CM | POA: Diagnosis present

## 2020-12-13 DIAGNOSIS — Z8249 Family history of ischemic heart disease and other diseases of the circulatory system: Secondary | ICD-10-CM | POA: Diagnosis not present

## 2020-12-13 DIAGNOSIS — E119 Type 2 diabetes mellitus without complications: Secondary | ICD-10-CM | POA: Diagnosis not present

## 2020-12-13 DIAGNOSIS — N529 Male erectile dysfunction, unspecified: Secondary | ICD-10-CM | POA: Diagnosis present

## 2020-12-13 DIAGNOSIS — M5136 Other intervertebral disc degeneration, lumbar region: Secondary | ICD-10-CM | POA: Diagnosis not present

## 2020-12-13 DIAGNOSIS — Z888 Allergy status to other drugs, medicaments and biological substances status: Secondary | ICD-10-CM

## 2020-12-13 DIAGNOSIS — M47897 Other spondylosis, lumbosacral region: Secondary | ICD-10-CM | POA: Diagnosis not present

## 2020-12-13 DIAGNOSIS — Z808 Family history of malignant neoplasm of other organs or systems: Secondary | ICD-10-CM | POA: Diagnosis not present

## 2020-12-13 DIAGNOSIS — M5117 Intervertebral disc disorders with radiculopathy, lumbosacral region: Secondary | ICD-10-CM | POA: Diagnosis not present

## 2020-12-13 DIAGNOSIS — Z4889 Encounter for other specified surgical aftercare: Secondary | ICD-10-CM | POA: Diagnosis not present

## 2020-12-13 DIAGNOSIS — Z885 Allergy status to narcotic agent status: Secondary | ICD-10-CM

## 2020-12-13 DIAGNOSIS — M5127 Other intervertebral disc displacement, lumbosacral region: Secondary | ICD-10-CM | POA: Diagnosis not present

## 2020-12-13 DIAGNOSIS — M5116 Intervertebral disc disorders with radiculopathy, lumbar region: Secondary | ICD-10-CM | POA: Diagnosis not present

## 2020-12-13 DIAGNOSIS — Z87891 Personal history of nicotine dependence: Secondary | ICD-10-CM

## 2020-12-13 DIAGNOSIS — I10 Essential (primary) hypertension: Secondary | ICD-10-CM | POA: Diagnosis present

## 2020-12-13 DIAGNOSIS — Z79899 Other long term (current) drug therapy: Secondary | ICD-10-CM | POA: Diagnosis not present

## 2020-12-13 DIAGNOSIS — Z20822 Contact with and (suspected) exposure to covid-19: Secondary | ICD-10-CM | POA: Diagnosis not present

## 2020-12-13 DIAGNOSIS — F1010 Alcohol abuse, uncomplicated: Secondary | ICD-10-CM | POA: Diagnosis present

## 2020-12-13 DIAGNOSIS — D649 Anemia, unspecified: Secondary | ICD-10-CM | POA: Diagnosis not present

## 2020-12-13 DIAGNOSIS — Z981 Arthrodesis status: Secondary | ICD-10-CM | POA: Diagnosis not present

## 2020-12-13 DIAGNOSIS — Z419 Encounter for procedure for purposes other than remedying health state, unspecified: Secondary | ICD-10-CM

## 2020-12-13 DIAGNOSIS — N400 Enlarged prostate without lower urinary tract symptoms: Secondary | ICD-10-CM | POA: Diagnosis present

## 2020-12-13 LAB — GLUCOSE, CAPILLARY
Glucose-Capillary: 123 mg/dL — ABNORMAL HIGH (ref 70–99)
Glucose-Capillary: 145 mg/dL — ABNORMAL HIGH (ref 70–99)
Glucose-Capillary: 259 mg/dL — ABNORMAL HIGH (ref 70–99)
Glucose-Capillary: 173 mg/dL — ABNORMAL HIGH (ref 70–99)

## 2020-12-13 LAB — ABO/RH: ABO/RH(D): O POS

## 2020-12-13 SURGERY — POSTERIOR LUMBAR FUSION 2 LEVEL
Anesthesia: General | Site: Back

## 2020-12-13 MED ORDER — MENTHOL 3 MG MT LOZG: 1.0000 | LOZENGE | OROMUCOSAL | Status: DC | PRN

## 2020-12-13 MED ORDER — INSULIN ASPART 100 UNIT/ML ~~LOC~~ SOLN: 0.0000 [IU] | Freq: Every day | SUBCUTANEOUS | Status: DC

## 2020-12-13 MED ORDER — BACLOFEN 10 MG PO TABS
10.0000 mg | ORAL_TABLET | Freq: Three times a day (TID) | ORAL | Status: DC
  Administered 2020-12-13 – 2020-12-15 (×6): 10 mg via ORAL
  Filled 2020-12-13 (×6): qty 1

## 2020-12-13 MED ORDER — HYDROCODONE-ACETAMINOPHEN 10-325 MG PO TABS: 1.0000 | ORAL_TABLET | ORAL | Status: DC | PRN

## 2020-12-13 MED ORDER — FAMOTIDINE 20 MG PO TABS
20.0000 mg | ORAL_TABLET | Freq: Two times a day (BID) | ORAL | Status: DC
  Administered 2020-12-13 – 2020-12-15 (×4): 20 mg via ORAL
  Filled 2020-12-13 (×4): qty 1

## 2020-12-13 MED ORDER — INSULIN ASPART 100 UNIT/ML ~~LOC~~ SOLN
0.0000 [IU] | Freq: Three times a day (TID) | SUBCUTANEOUS | Status: DC
  Administered 2020-12-14: 3 [IU] via SUBCUTANEOUS
  Administered 2020-12-15: 2 [IU] via SUBCUTANEOUS

## 2020-12-13 MED ORDER — MIDAZOLAM HCL 2 MG/2ML IJ SOLN
INTRAMUSCULAR | Status: AC
  Filled 2020-12-13: qty 2

## 2020-12-13 MED ORDER — PHENYLEPHRINE 40 MCG/ML (10ML) SYRINGE FOR IV PUSH (FOR BLOOD PRESSURE SUPPORT)
PREFILLED_SYRINGE | INTRAVENOUS | Status: DC | PRN
  Administered 2020-12-13 (×2): 80 ug via INTRAVENOUS
  Administered 2020-12-13: 200 ug via INTRAVENOUS

## 2020-12-13 MED ORDER — PREGABALIN 75 MG PO CAPS
75.0000 mg | ORAL_CAPSULE | Freq: Every day | ORAL | Status: DC
  Administered 2020-12-14 – 2020-12-15 (×2): 75 mg via ORAL
  Filled 2020-12-13 (×2): qty 1

## 2020-12-13 MED ORDER — PHENYLEPHRINE HCL-NACL 10-0.9 MG/250ML-% IV SOLN
INTRAVENOUS | Status: DC | PRN
  Administered 2020-12-13: 25 ug/min via INTRAVENOUS

## 2020-12-13 MED ORDER — CHLORHEXIDINE GLUCONATE 0.12 % MT SOLN
15.0000 mL | Freq: Once | OROMUCOSAL | Status: AC
  Administered 2020-12-13: 15 mL via OROMUCOSAL
  Filled 2020-12-13: qty 15

## 2020-12-13 MED ORDER — ACETAMINOPHEN 650 MG RE SUPP: 650.0000 mg | RECTAL | Status: DC | PRN

## 2020-12-13 MED ORDER — FENTANYL CITRATE (PF) 250 MCG/5ML IJ SOLN
INTRAMUSCULAR | Status: AC
  Filled 2020-12-13: qty 5

## 2020-12-13 MED ORDER — ONDANSETRON HCL 4 MG/2ML IJ SOLN: 4.0000 mg | Freq: Four times a day (QID) | INTRAMUSCULAR | Status: DC | PRN

## 2020-12-13 MED ORDER — NAPHAZOLINE-GLYCERIN 0.012-0.2 % OP SOLN
1.0000 [drp] | Freq: Every day | OPHTHALMIC | Status: DC | PRN
  Filled 2020-12-13: qty 15

## 2020-12-13 MED ORDER — ADULT MULTIVITAMIN W/MINERALS CH
1.0000 | ORAL_TABLET | Freq: Every day | ORAL | Status: DC
  Administered 2020-12-13 – 2020-12-15 (×3): 1 via ORAL
  Filled 2020-12-13 (×3): qty 1

## 2020-12-13 MED ORDER — CHLORHEXIDINE GLUCONATE CLOTH 2 % EX PADS: 6.0000 | MEDICATED_PAD | Freq: Once | CUTANEOUS | Status: DC

## 2020-12-13 MED ORDER — 0.9 % SODIUM CHLORIDE (POUR BTL) OPTIME
TOPICAL | Status: DC | PRN
  Administered 2020-12-13: 1000 mL

## 2020-12-13 MED ORDER — ACETAMINOPHEN 325 MG PO TABS
650.0000 mg | ORAL_TABLET | ORAL | Status: DC | PRN
  Administered 2020-12-14 – 2020-12-15 (×3): 650 mg via ORAL
  Filled 2020-12-13 (×4): qty 2

## 2020-12-13 MED ORDER — THROMBIN 20000 UNITS EX SOLR
CUTANEOUS | Status: DC | PRN
  Administered 2020-12-13 (×2): 20 mL via TOPICAL

## 2020-12-13 MED ORDER — SUGAMMADEX SODIUM 200 MG/2ML IV SOLN: INTRAVENOUS | Status: DC | PRN

## 2020-12-13 MED ORDER — PREGABALIN 75 MG PO CAPS
150.0000 mg | ORAL_CAPSULE | Freq: Every day | ORAL | Status: DC
  Administered 2020-12-13 – 2020-12-14 (×2): 150 mg via ORAL
  Filled 2020-12-13 (×2): qty 2

## 2020-12-13 MED ORDER — VANCOMYCIN HCL 1000 MG IV SOLR
INTRAVENOUS | Status: DC | PRN
  Administered 2020-12-13: 1000 mg

## 2020-12-13 MED ORDER — THROMBIN 20000 UNITS EX SOLR
CUTANEOUS | Status: AC
  Filled 2020-12-13: qty 20000

## 2020-12-13 MED ORDER — OXYCODONE HCL 5 MG PO TABS
10.0000 mg | ORAL_TABLET | ORAL | Status: DC | PRN
  Administered 2020-12-13 – 2020-12-14 (×6): 10 mg via ORAL
  Administered 2020-12-15: 5 mg via ORAL
  Administered 2020-12-15 (×2): 10 mg via ORAL
  Filled 2020-12-13 (×9): qty 2

## 2020-12-13 MED ORDER — LIDOCAINE 2% (20 MG/ML) 5 ML SYRINGE
INTRAMUSCULAR | Status: AC
  Filled 2020-12-13: qty 5

## 2020-12-13 MED ORDER — VITAMIN D 25 MCG (1000 UNIT) PO TABS
1000.0000 [IU] | ORAL_TABLET | Freq: Every day | ORAL | Status: DC
  Administered 2020-12-13 – 2020-12-15 (×3): 1000 [IU] via ORAL
  Filled 2020-12-13 (×3): qty 1

## 2020-12-13 MED ORDER — METFORMIN HCL ER 500 MG PO TB24
500.0000 mg | ORAL_TABLET | Freq: Two times a day (BID) | ORAL | Status: DC
  Administered 2020-12-13 – 2020-12-15 (×4): 500 mg via ORAL
  Filled 2020-12-13 (×4): qty 1

## 2020-12-13 MED ORDER — ADULT ONE DAILY GUMMIES PO CHEW: 1.0000 | CHEWABLE_TABLET | Freq: Every day | ORAL | Status: DC

## 2020-12-13 MED ORDER — PHENYLEPHRINE HCL (PRESSORS) 10 MG/ML IV SOLN
INTRAVENOUS | Status: AC
  Filled 2020-12-13: qty 1

## 2020-12-13 MED ORDER — ONDANSETRON HCL 4 MG PO TABS: 4.0000 mg | ORAL_TABLET | Freq: Four times a day (QID) | ORAL | Status: DC | PRN

## 2020-12-13 MED ORDER — CEFAZOLIN SODIUM-DEXTROSE 2-4 GM/100ML-% IV SOLN
2.0000 g | INTRAVENOUS | Status: AC
  Administered 2020-12-13: 2 g via INTRAVENOUS
  Filled 2020-12-13: qty 100

## 2020-12-13 MED ORDER — NAPHAZOLINE-GLYCERIN-ZINC SULF 0.012-0.25-0.25 % OP SOLN: 1.0000 [drp] | Freq: Every day | OPHTHALMIC | Status: DC | PRN

## 2020-12-13 MED ORDER — HYDROCHLOROTHIAZIDE 12.5 MG PO CAPS
12.5000 mg | ORAL_CAPSULE | Freq: Every day | ORAL | Status: DC
  Administered 2020-12-14 – 2020-12-15 (×2): 12.5 mg via ORAL
  Filled 2020-12-13 (×2): qty 1

## 2020-12-13 MED ORDER — CEFAZOLIN SODIUM-DEXTROSE 1-4 GM/50ML-% IV SOLN
1.0000 g | Freq: Three times a day (TID) | INTRAVENOUS | Status: AC
  Administered 2020-12-13 – 2020-12-14 (×2): 1 g via INTRAVENOUS
  Filled 2020-12-13 (×2): qty 50

## 2020-12-13 MED ORDER — BISACODYL 10 MG RE SUPP: 10.0000 mg | Freq: Every day | RECTAL | Status: DC | PRN

## 2020-12-13 MED ORDER — LOSARTAN POTASSIUM-HCTZ 50-12.5 MG PO TABS: 1.0000 | ORAL_TABLET | Freq: Every day | ORAL | Status: DC

## 2020-12-13 MED ORDER — METOPROLOL SUCCINATE ER 100 MG PO TB24
100.0000 mg | ORAL_TABLET | Freq: Every day | ORAL | Status: DC
  Administered 2020-12-13: 100 mg via ORAL
  Filled 2020-12-13 (×2): qty 1

## 2020-12-13 MED ORDER — NALOXONE HCL 0.4 MG/ML IJ SOLN: 0.4000 mg | INTRAMUSCULAR | Status: DC | PRN

## 2020-12-13 MED ORDER — VITAMIN B-12 1000 MCG PO TABS
1000.0000 ug | ORAL_TABLET | Freq: Every day | ORAL | Status: DC
  Administered 2020-12-13 – 2020-12-15 (×3): 1000 ug via ORAL
  Filled 2020-12-13 (×3): qty 1

## 2020-12-13 MED ORDER — POTASSIUM CHLORIDE CRYS ER 10 MEQ PO TBCR
10.0000 meq | EXTENDED_RELEASE_TABLET | Freq: Every day | ORAL | Status: DC
  Administered 2020-12-14 – 2020-12-15 (×2): 10 meq via ORAL
  Filled 2020-12-13 (×2): qty 1

## 2020-12-13 MED ORDER — ALBUMIN HUMAN 5 % IV SOLN: INTRAVENOUS | Status: DC | PRN

## 2020-12-13 MED ORDER — SODIUM CHLORIDE 0.9 % IV SOLN: 250.0000 mL | INTRAVENOUS | Status: DC

## 2020-12-13 MED ORDER — PROPOFOL 10 MG/ML IV BOLUS
INTRAVENOUS | Status: AC
  Filled 2020-12-13: qty 40

## 2020-12-13 MED ORDER — ESCITALOPRAM OXALATE 20 MG PO TABS
20.0000 mg | ORAL_TABLET | Freq: Every day | ORAL | Status: DC
  Administered 2020-12-13 – 2020-12-14 (×2): 20 mg via ORAL
  Filled 2020-12-13 (×2): qty 1

## 2020-12-13 MED ORDER — ROPINIROLE HCL 0.25 MG PO TABS
0.1250 mg | ORAL_TABLET | Freq: Every day | ORAL | Status: DC
  Administered 2020-12-13 – 2020-12-14 (×2): 0.125 mg via ORAL
  Filled 2020-12-13 (×2): qty 0.5

## 2020-12-13 MED ORDER — ORAL CARE MOUTH RINSE: 15.0000 mL | Freq: Once | OROMUCOSAL | Status: AC

## 2020-12-13 MED ORDER — FLEET ENEMA 7-19 GM/118ML RE ENEM: 1.0000 | ENEMA | Freq: Once | RECTAL | Status: DC | PRN

## 2020-12-13 MED ORDER — DEXAMETHASONE SODIUM PHOSPHATE 10 MG/ML IJ SOLN
10.0000 mg | Freq: Once | INTRAMUSCULAR | Status: AC
  Administered 2020-12-13: 10 mg via INTRAVENOUS
  Filled 2020-12-13: qty 1

## 2020-12-13 MED ORDER — SODIUM CHLORIDE 0.9% FLUSH
3.0000 mL | Freq: Two times a day (BID) | INTRAVENOUS | Status: DC
  Administered 2020-12-13 – 2020-12-14 (×3): 3 mL via INTRAVENOUS

## 2020-12-13 MED ORDER — ONDANSETRON HCL 4 MG/2ML IJ SOLN
INTRAMUSCULAR | Status: DC | PRN
  Administered 2020-12-13: 4 mg via INTRAVENOUS

## 2020-12-13 MED ORDER — DIAZEPAM 5 MG PO TABS
5.0000 mg | ORAL_TABLET | Freq: Four times a day (QID) | ORAL | Status: DC | PRN
  Administered 2020-12-13 – 2020-12-15 (×2): 5 mg via ORAL
  Filled 2020-12-13 (×2): qty 1

## 2020-12-13 MED ORDER — FENTANYL CITRATE (PF) 100 MCG/2ML IJ SOLN
INTRAMUSCULAR | Status: AC
  Filled 2020-12-13: qty 2

## 2020-12-13 MED ORDER — KETOROLAC TROMETHAMINE 30 MG/ML IJ SOLN
30.0000 mg | Freq: Four times a day (QID) | INTRAMUSCULAR | Status: DC
  Administered 2020-12-13 – 2020-12-15 (×7): 30 mg via INTRAVENOUS
  Filled 2020-12-13 (×6): qty 1

## 2020-12-13 MED ORDER — LACTATED RINGERS IV SOLN: INTRAVENOUS | Status: DC

## 2020-12-13 MED ORDER — FENTANYL CITRATE (PF) 100 MCG/2ML IJ SOLN
INTRAMUSCULAR | Status: AC
  Administered 2020-12-13: 25 ug via INTRAVENOUS
  Filled 2020-12-13: qty 2

## 2020-12-13 MED ORDER — SODIUM CHLORIDE 0.9 % IV SOLN: INTRAVENOUS | Status: DC | PRN

## 2020-12-13 MED ORDER — PROPOFOL 10 MG/ML IV BOLUS
INTRAVENOUS | Status: DC | PRN
  Administered 2020-12-13: 200 mg via INTRAVENOUS

## 2020-12-13 MED ORDER — EPHEDRINE SULFATE-NACL 50-0.9 MG/10ML-% IV SOSY
PREFILLED_SYRINGE | INTRAVENOUS | Status: DC | PRN
  Administered 2020-12-13: 10 mg via INTRAVENOUS
  Administered 2020-12-13: 20 mg via INTRAVENOUS
  Administered 2020-12-13 (×2): 10 mg via INTRAVENOUS

## 2020-12-13 MED ORDER — SODIUM CHLORIDE 0.9% FLUSH: 3.0000 mL | INTRAVENOUS | Status: DC | PRN

## 2020-12-13 MED ORDER — FUROSEMIDE 20 MG PO TABS
20.0000 mg | ORAL_TABLET | Freq: Every day | ORAL | Status: DC
  Administered 2020-12-14 – 2020-12-15 (×2): 20 mg via ORAL
  Filled 2020-12-13 (×2): qty 1

## 2020-12-13 MED ORDER — MIDAZOLAM HCL 2 MG/2ML IJ SOLN
INTRAMUSCULAR | Status: DC | PRN
  Administered 2020-12-13: 2 mg via INTRAVENOUS

## 2020-12-13 MED ORDER — FENTANYL CITRATE (PF) 100 MCG/2ML IJ SOLN
INTRAMUSCULAR | Status: DC | PRN
  Administered 2020-12-13 (×2): 50 ug via INTRAVENOUS
  Administered 2020-12-13: 100 ug via INTRAVENOUS
  Administered 2020-12-13: 50 ug via INTRAVENOUS

## 2020-12-13 MED ORDER — EZETIMIBE 10 MG PO TABS
10.0000 mg | ORAL_TABLET | Freq: Every day | ORAL | Status: DC
  Administered 2020-12-13 – 2020-12-14 (×2): 10 mg via ORAL
  Filled 2020-12-13 (×2): qty 1

## 2020-12-13 MED ORDER — BUPIVACAINE HCL (PF) 0.25 % IJ SOLN
INTRAMUSCULAR | Status: AC
  Filled 2020-12-13: qty 30

## 2020-12-13 MED ORDER — HYDROMORPHONE HCL 1 MG/ML IJ SOLN
1.0000 mg | INTRAMUSCULAR | Status: DC | PRN
  Administered 2020-12-13 (×2): 1 mg via INTRAVENOUS
  Filled 2020-12-13 (×2): qty 1

## 2020-12-13 MED ORDER — TERAZOSIN HCL 1 MG PO CAPS
2.0000 mg | ORAL_CAPSULE | Freq: Every day | ORAL | Status: DC
  Administered 2020-12-14 – 2020-12-15 (×2): 2 mg via ORAL
  Filled 2020-12-13: qty 1
  Filled 2020-12-13 (×2): qty 2

## 2020-12-13 MED ORDER — VANCOMYCIN HCL 1000 MG IV SOLR
INTRAVENOUS | Status: AC
  Filled 2020-12-13: qty 1000

## 2020-12-13 MED ORDER — BUPIVACAINE HCL (PF) 0.25 % IJ SOLN
INTRAMUSCULAR | Status: DC | PRN
  Administered 2020-12-13: 20 mL

## 2020-12-13 MED ORDER — ALPRAZOLAM 0.5 MG PO TABS
0.5000 mg | ORAL_TABLET | Freq: Every day | ORAL | Status: DC
  Administered 2020-12-13 – 2020-12-14 (×2): 0.5 mg via ORAL
  Filled 2020-12-13 (×2): qty 1

## 2020-12-13 MED ORDER — POLYETHYLENE GLYCOL 3350 17 G PO PACK: 17.0000 g | PACK | Freq: Every day | ORAL | Status: DC | PRN

## 2020-12-13 MED ORDER — LOSARTAN POTASSIUM 50 MG PO TABS
50.0000 mg | ORAL_TABLET | Freq: Every day | ORAL | Status: DC
  Administered 2020-12-14 – 2020-12-15 (×2): 50 mg via ORAL
  Filled 2020-12-13 (×2): qty 1

## 2020-12-13 MED ORDER — PHENOL 1.4 % MT LIQD: 1.0000 | OROMUCOSAL | Status: DC | PRN

## 2020-12-13 MED ORDER — FENTANYL CITRATE (PF) 100 MCG/2ML IJ SOLN
25.0000 ug | INTRAMUSCULAR | Status: DC | PRN
  Administered 2020-12-13: 50 ug via INTRAVENOUS
  Administered 2020-12-13: 25 ug via INTRAVENOUS
  Administered 2020-12-13: 50 ug via INTRAVENOUS

## 2020-12-13 MED ORDER — ROCURONIUM BROMIDE 10 MG/ML (PF) SYRINGE
PREFILLED_SYRINGE | INTRAVENOUS | Status: DC | PRN
  Administered 2020-12-13: 20 mg via INTRAVENOUS
  Administered 2020-12-13: 60 mg via INTRAVENOUS
  Administered 2020-12-13: 10 mg via INTRAVENOUS

## 2020-12-13 MED ORDER — LIDOCAINE 2% (20 MG/ML) 5 ML SYRINGE
INTRAMUSCULAR | Status: DC | PRN
  Administered 2020-12-13: 60 mg via INTRAVENOUS

## 2020-12-13 MED ORDER — ONDANSETRON HCL 4 MG/2ML IJ SOLN
INTRAMUSCULAR | Status: AC
  Filled 2020-12-13: qty 2

## 2020-12-13 SURGICAL SUPPLY — 61 items
BAG DECANTER FOR FLEXI CONT (MISCELLANEOUS) ×2 IMPLANT
BENZOIN TINCTURE PRP APPL 2/3 (GAUZE/BANDAGES/DRESSINGS) ×2 IMPLANT
BLADE CLIPPER SURG (BLADE) IMPLANT
BONE GRAFTON DBF INJECT 6CC (Bone Implant) ×4 IMPLANT
BUR CUTTER 7.0 ROUND (BURR) IMPLANT
BUR MATCHSTICK NEURO 3.0 LAGG (BURR) ×2 IMPLANT
CAGE EXP CATALYFT 9 (Plate) ×8 IMPLANT
CANISTER SUCT 3000ML PPV (MISCELLANEOUS) ×2 IMPLANT
CAP LCK SPNE (Orthopedic Implant) ×6 IMPLANT
CAP LOCK SPINE RADIUS (Orthopedic Implant) ×6 IMPLANT
CAP LOCKING (Orthopedic Implant) ×6 IMPLANT
CARTRIDGE OIL MAESTRO DRILL (MISCELLANEOUS) ×1 IMPLANT
CNTNR URN SCR LID CUP LEK RST (MISCELLANEOUS) ×1 IMPLANT
CONT SPEC 4OZ STRL OR WHT (MISCELLANEOUS) ×1
COVER BACK TABLE 60X90IN (DRAPES) ×2 IMPLANT
COVER WAND RF STERILE (DRAPES) ×2 IMPLANT
DECANTER SPIKE VIAL GLASS SM (MISCELLANEOUS) ×2 IMPLANT
DERMABOND ADVANCED (GAUZE/BANDAGES/DRESSINGS) ×1
DERMABOND ADVANCED .7 DNX12 (GAUZE/BANDAGES/DRESSINGS) ×1 IMPLANT
DIFFUSER DRILL AIR PNEUMATIC (MISCELLANEOUS) ×2 IMPLANT
DRAPE C-ARM 42X72 X-RAY (DRAPES) ×4 IMPLANT
DRAPE HALF SHEET 40X57 (DRAPES) IMPLANT
DRAPE LAPAROTOMY 100X72X124 (DRAPES) ×2 IMPLANT
DRAPE SURG 17X23 STRL (DRAPES) ×8 IMPLANT
DRSG OPSITE POSTOP 4X6 (GAUZE/BANDAGES/DRESSINGS) ×2 IMPLANT
DURAPREP 26ML APPLICATOR (WOUND CARE) ×2 IMPLANT
ELECT REM PT RETURN 9FT ADLT (ELECTROSURGICAL) ×2
ELECTRODE REM PT RTRN 9FT ADLT (ELECTROSURGICAL) ×1 IMPLANT
EVACUATOR 1/8 PVC DRAIN (DRAIN) IMPLANT
GAUZE 4X4 16PLY RFD (DISPOSABLE) IMPLANT
GAUZE SPONGE 4X4 12PLY STRL (GAUZE/BANDAGES/DRESSINGS) IMPLANT
GLOVE BIO SURGEON STRL SZ 6.5 (GLOVE) ×6 IMPLANT
GLOVE ECLIPSE 9.0 STRL (GLOVE) ×6 IMPLANT
GLOVE SRG 8 PF TXTR STRL LF DI (GLOVE) ×2 IMPLANT
GLOVE SURG UNDER POLY LF SZ6.5 (GLOVE) ×6 IMPLANT
GLOVE SURG UNDER POLY LF SZ8 (GLOVE) ×2
GOWN STRL REUS W/ TWL LRG LVL3 (GOWN DISPOSABLE) ×3 IMPLANT
GOWN STRL REUS W/ TWL XL LVL3 (GOWN DISPOSABLE) ×2 IMPLANT
GOWN STRL REUS W/TWL 2XL LVL3 (GOWN DISPOSABLE) IMPLANT
GOWN STRL REUS W/TWL LRG LVL3 (GOWN DISPOSABLE) ×3
GOWN STRL REUS W/TWL XL LVL3 (GOWN DISPOSABLE) ×2
KIT BASIN OR (CUSTOM PROCEDURE TRAY) ×2 IMPLANT
KIT TURNOVER KIT B (KITS) ×2 IMPLANT
MILL MEDIUM DISP (BLADE) ×2 IMPLANT
NEEDLE HYPO 22GX1.5 SAFETY (NEEDLE) ×2 IMPLANT
NS IRRIG 1000ML POUR BTL (IV SOLUTION) ×2 IMPLANT
OIL CARTRIDGE MAESTRO DRILL (MISCELLANEOUS) ×2
PACK LAMINECTOMY NEURO (CUSTOM PROCEDURE TRAY) ×2 IMPLANT
ROD MAX 50MM (Rod) ×4 IMPLANT
SCREW 5.75X40M (Screw) ×4 IMPLANT
SCREW 5.75X45MM (Screw) ×8 IMPLANT
SPONGE SURGIFOAM ABS GEL 100 (HEMOSTASIS) ×2 IMPLANT
STRIP CLOSURE SKIN 1/2X4 (GAUZE/BANDAGES/DRESSINGS) ×2 IMPLANT
SUT VIC AB 0 CT1 18XCR BRD8 (SUTURE) ×2 IMPLANT
SUT VIC AB 0 CT1 8-18 (SUTURE) ×2
SUT VIC AB 2-0 CT1 18 (SUTURE) ×2 IMPLANT
SUT VIC AB 3-0 SH 8-18 (SUTURE) ×6 IMPLANT
TOWEL GREEN STERILE (TOWEL DISPOSABLE) ×2 IMPLANT
TOWEL GREEN STERILE FF (TOWEL DISPOSABLE) ×2 IMPLANT
TRAY FOLEY MTR SLVR 16FR STAT (SET/KITS/TRAYS/PACK) ×2 IMPLANT
WATER STERILE IRR 1000ML POUR (IV SOLUTION) ×2 IMPLANT

## 2020-12-13 NOTE — Brief Op Note (Signed)
12/13/2020  11:49 AM  PATIENT:  Rodney Harper  65 y.o. male  PRE-OPERATIVE DIAGNOSIS:  STENOSIS  POST-OPERATIVE DIAGNOSIS:  STENOSIS  PROCEDURE:  Procedure(s): POSTERIOR LUMBAR INTERBODY FUSION  - LUMBAR FOUR--LUMBAR FIVE - LUMBAR FIVE-SACRAL ONE (N/A)  SURGEON:  Surgeon(s) and Role:    * Lesleyann Fichter, Mallie Mussel, MD - Primary    * Ashok Pall, MD - Assisting  PHYSICIAN ASSISTANT:   ASSISTANTS:    ANESTHESIA:   general  EBL:  600 mL   BLOOD ADMINISTERED:none  DRAINS: none   LOCAL MEDICATIONS USED:  MARCAINE     SPECIMEN:  No Specimen  DISPOSITION OF SPECIMEN:  N/A  COUNTS:  YES  TOURNIQUET:  * No tourniquets in log *  DICTATION: .Dragon Dictation  PLAN OF CARE: Admit for overnight observation  PATIENT DISPOSITION:  PACU - hemodynamically stable.   Delay start of Pharmacological VTE agent (>24hrs) due to surgical blood loss or risk of bleeding: yes

## 2020-12-13 NOTE — H&P (Signed)
Rodney Harper is an 65 y.o. male.   Chief Complaint: Back pain HPI: 65 year old male with severe back pain with radiation to his lower extremities right much worse than left.  Patient with progressive numbness and some weakness into the dorsum of his right foot.  He has no left-sided weakness.  Symptoms are aggravated by prolonged standing or walking.  They are relieved by sitting and bending forward.  He has been treated with anti-inflammatory occasions epidural steroid injections and therapy all without improvement.  Patient is miserable with his current situation.  Work-up demonstrates evidence of prior surgery at the L4-5 level with severe disc space collapse and marked lateral recess and foraminal stenosis bilaterally.  Patient at L5-S1 has a broad-based chronic disc protrusion with associated spondylosis and significant right-sided lateral recess stenosis and foraminal stenosis.  Past Medical History:  Diagnosis Date  . Alcohol abuse   . Anemia   . Anxiety   . Benign essential HTN   . Benign prostatic hyperplasia without lower urinary tract symptoms   . Chronic edema   . Chronic liver disease and cirrhosis ([place])   . COVID-19    x2  . History of kidney stones   . Hypogonadism in male   . Lumbar disc herniation with radiculopathy   . Mild episode of recurrent major depressive disorder ([place])   . Mixed hyperlipidemia   . Pain disorder   . Pneumonia   . Primary insomnia   . Seizures ([place])   . Type 2 diabetes mellitus with diabetic polyneuropathy, with long-term current use of insulin ([place])   . Vitamin B12 deficiency   . Vitamin D deficiency     Past Surgical History:  Procedure Laterality Date  . ANKLE FRACTURE SURGERY     Left  . APPENDECTOMY    . BACK SURGERY    . COLONOSCOPY  05/07/2012   Mild sigmoid diverticulosis. Small internal hemorrhoids. OTherwise normal colonoscopy.   . ESOPHAGOGASTRODUODENOSCOPY  01/26/2020   [facility]    Family History   Problem Relation Age of Onset  . Throat cancer Mother   . Heart disease Father   . Heart attack Father   . Hypertension Father   . Colon cancer Neg Hx   . Rectal cancer Neg Hx    Social History:  reports that he has quit smoking. He has never used smokeless tobacco. He reports previous alcohol use. He reports previous drug use.  Allergies:  Allergies  Allergen Reactions  . Carbamazepine Other (See Comments)    Unknown  . Clonidine Other (See Comments)    Cause to have all of his teeth pulled and finger nail came off Reaction internally instead of externally    Medications Prior to Admission  Medication Sig Dispense Refill  . ALPRAZolam (XANAX) 0.5 MG tablet Take 0.5 mg by mouth at bedtime.    . baclofen (LIORESAL) 10 MG tablet Take 10 mg by mouth 3 (three) times daily.    . Cholecalciferol (VITAMIN D-1000 MAX ST) 25 MCG (1000 UT) tablet Take 1,000 Units by mouth daily.    Marland Kitchen escitalopram (LEXAPRO) 20 MG tablet Take 20 mg by mouth at bedtime.    Marland Kitchen ezetimibe (ZETIA) 10 MG tablet Take 10 mg by mouth at bedtime.    . famotidine (PEPCID) 20 MG tablet Take 20 mg by mouth 2 (two) times daily.    . furosemide (LASIX) 20 MG tablet Take 20 mg by mouth daily.    Marland Kitchen losartan-hydrochlorothiazide (HYZAAR) 50-12.5 MG  tablet Take 1 tablet by mouth daily.    . metFORMIN (GLUCOPHAGE-XR) 500 MG 24 hr tablet Take 500 mg by mouth 2 (two) times a day.    . metoprolol succinate (TOPROL-XL) 100 MG 24 hr tablet Take 100 mg by mouth at bedtime.    . Multiple Vitamins-Minerals (ADULT ONE DAILY GUMMIES) CHEW Chew 1 tablet by mouth daily. CBD    . naloxone (NARCAN) 0.4 MG/ML injection Inject 0.4 mg into the vein as needed (over dose).    . Naphazoline-Glycerin-Zinc Sulf (CLEAR EYES COOLING COMFORT) 0.012-0.25-0.25 % SOLN Place 1 drop into both eyes daily as needed (eye irritation).    Marland Kitchen oxyCODONE-acetaminophen (PERCOCET) 10-325 MG tablet Take 1 tablet by mouth every 6 (six) hours as needed for pain.    .  potassium chloride (KLOR-CON) 10 MEQ tablet Take 10 mEq by mouth daily.    . pregabalin (LYRICA) 75 MG capsule Take 75 mg by mouth See admin instructions. Take 75 mg in the morning and evening and 150 mg at bedtime    . rOPINIRole (REQUIP) 0.25 MG tablet Take 0.5 tablets by mouth at bedtime.    Marland Kitchen terazosin (HYTRIN) 2 MG capsule Take 2 mg by mouth daily.    Marland Kitchen testosterone cypionate (DEPOTESTOSTERONE CYPIONATE) 200 MG/ML injection Inject 200 mg into the muscle every 14 (fourteen) days.    . vitamin B-12 (CYANOCOBALAMIN) 1000 MCG tablet Take 1,000 mcg by mouth daily.    Marland Kitchen acetaminophen (TYLENOL) 500 MG tablet Take 1,000 mg by mouth every 6 (six) hours as needed for moderate pain or mild pain.    . tadalafil (CIALIS) 5 MG tablet Take 5 mg by mouth daily as needed for erectile dysfunction.      Results for orders placed or performed during the hospital encounter of 12/13/20 (from the past 48 hour(s))  Glucose, capillary     Status: Abnormal   Collection Time: 12/13/20  6:37 AM  Result Value Ref Range   Glucose-Capillary 123 (H) 70 - 99 mg/dL    Comment: Glucose reference range applies only to samples taken after fasting for at least 8 hours.   No results found.  Pertinent items noted in HPI and remainder of comprehensive ROS otherwise negative.  Blood pressure 109/69, pulse 60, temperature 97.8 F (36.6 C), temperature source Oral, resp. rate 17, height 5\' 10"  (1.778 m), weight 93 kg, SpO2 96 %.  Patient is awake and alert.  He is oriented and appropriate.  Speech is fluent.  Judgment insight are intact.  Cranial nerve function normal bilateral motor examination extremities reveals mild weakness of dorsiflexion on the right side with 4/5 strength in both his external houses longus and anterior tibialis.  Sensory lamination some decrease sensation pinprick light touch in his right L4-5 and S1 dermatomes.  Deep cervix is normal active scepter his Achilles reflexes are absent.  Gait is antalgic.   Posture is mildly flexed.  Examination head ears eyes nose throat unremarkable chest and abdomen are benign.  Extremities are free of major deformity. Assessment/Plan L4-5, L5-S1 severe disc degeneration with marked foraminal stenosis and radiculopathy.  Plan L4-5, L5-S1 decompressive laminotomies foraminotomies followed by posterior lumbar interbody fusion utilizing interbody cages, morselized autograft, allograft, and posterior letter arthrodesis utilizing segmental pedicle screw fixation local autografting.  Risks and benefits of been explained.  Patient wishes to proceed.  Mallie Mussel A Lorence Nagengast 12/13/2020, 7:57 AM

## 2020-12-13 NOTE — Anesthesia Procedure Notes (Signed)
Procedure Name: Intubation Date/Time: 12/13/2020 8:14 AM Performed by: Leonor Liv, CRNA Pre-anesthesia Checklist: Patient identified, Emergency Drugs available, Suction available and Patient being monitored Patient Re-evaluated:Patient Re-evaluated prior to induction Oxygen Delivery Method: Circle System Utilized Preoxygenation: Pre-oxygenation with 100% oxygen Induction Type: IV induction Ventilation: Mask ventilation without difficulty and Oral airway inserted - appropriate to patient size Grade View: Grade I Tube type: Oral Tube size: 7.5 mm Number of attempts: 1 Airway Equipment and Method: Stylet and Oral airway Placement Confirmation: ETT inserted through vocal cords under direct vision,  positive ETCO2 and breath sounds checked- equal and bilateral Secured at: 23 cm Tube secured with: Tape Dental Injury: Teeth and Oropharynx as per pre-operative assessment

## 2020-12-13 NOTE — Transfer of Care (Signed)
Immediate Anesthesia Transfer of Care Note  Patient: Rodney Harper  Procedure(s) Performed: POSTERIOR LUMBAR INTERBODY FUSION  - LUMBAR FOUR--LUMBAR FIVE - LUMBAR FIVE-SACRAL ONE (N/A Back)  Patient Location: PACU  Anesthesia Type:General  Level of Consciousness: sedated  Airway & Oxygen Therapy: Patient Spontanous Breathing, Patient connected to face mask oxygen and oral airway  Post-op Assessment: Report given to RN and Post -op Vital signs reviewed and stable  Post vital signs: Reviewed and stable  Last Vitals:  Vitals Value Taken Time  BP 111/64 12/13/20 1331  Temp 36.9 C 12/13/20 1215  Pulse 70 12/13/20 1332  Resp 14 12/13/20 1332  SpO2 96 % 12/13/20 1332  Vitals shown include unvalidated device data.  Last Pain:  Vitals:   12/13/20 1250  TempSrc:   PainSc: Asleep      Patients Stated Pain Goal: 3 (16/60/60 0459)  Complications: No complications documented.

## 2020-12-13 NOTE — Op Note (Signed)
Date of procedure: 12/13/2020  Date of dictation: Same  Service: Neurosurgery  Preoperative diagnosis: L4-5, L5-S1 degenerative disc disease with lateral recess and severe foraminal stenosis and radiculopathy  Postoperative diagnosis: Same  Procedure Name: Bilateral L4-5, L5-S1 decompressive laminotomies and foraminotomies, redo, more than would be required for simple interbody fusion alone.  L4-5 posterior lumbar interbody fusion utilizing interbody cages and locally harvested autograft and allograft  L4-5 posterior lateral arthrodesis utilizing segmental pedicle screw fixation and local autograft.  Surgeon:Ira Dougher A.Tarnesha Ulloa, M.D.  Asst. Surgeon: Daun Peacock, NP   Anesthesia: General  Indication: 65 year old male with history of prior L4-5 surgery presents with severe back and bilateral lower extremity symptoms right greater than left.  Work-up demonstrates evidence of severe degeneration with disc space collapse and extreme Modic endplate changes at Y8-6 with severe lateral recess and foraminal stenosis right greater than left.  Patient also with a broad-based disc herniation extending into the foramen at L5-S1 with compression of the right S1 nerve root and exiting right L5 nerve root.  Patient presents now for two-level lumbar decompression and fusion in hopes of improving his symptoms.  Operative note: After induction anesthesia, patient position prone on the Wilson frame properly padded.  Lumbar region prepped and draped sterilely.  Incision made overlying L4-5 and S1.  Dissection performed bilaterally.  Retractor placed.  Fluoroscopy used.  Levels confirmed.  Decompressive laminotomies and facetectomies were then performed bilaterally using [place] rongeurs care centers and high-speed drill.  The inferior two thirds of the lamina of L4 and L5 were removed bilaterally.  The entire facets of L4 and L5 inferiorly were removed bilaterally the superior facets of L5 and S1 were  partially resected.  The lamina of L5 and S1 was resected superiorly.  In the process of working around the patient's old surgery on the right side the lamina of L5 was completely resected.  A laminectomy was then completely performed for L5.  Bilateral discectomies were then performed at L4-5 and L5-S1.  Disc space was then sequentially released at both levels.  With a distractor placed patient's right side to space prepared on the left side.  The space was cleaned of all soft tissue.  9 mm [employer] expandable cages were then impacted in the place and expanded at both L4-5 and L5-S1.  Distractor was moved patient's right side.  The space was prepared on the right side.  Morselized autograft packed in interspace.  A second cage was then packed into place and expanded.  Demineralized bone fibers were then packed throughout the cages and around the cages further filling the interspace.  Pedicles of L4 and L5 and S1 were identified using surface landmarks and intraoperative fluoroscopy superficial bone around the pedicle was then removed using high-speed drill each pedicle was then probed using pedicle all each pedicle all track was then probed and found to be solidly within the bone.  SPECT all track was then tapped with a screw tap.  Screw drill hole was probed and found to be solid within the bone.  5.75 mm radius brand screws from [employer] placed bilaterally at L4-L5 and S1 bilaterally.  Final images reveal good position of the cages and the hardware at the proper upper level with normal alignment of spine.  Short segment titanium rod placed to the screws at L4-L5 and S1.  Locking caps placed through the screw heads.  Locking caps then engaged with a construct under compression.  Morselized autograft was then packed posterior laterally  on the transverse processes of L5-S1 and L4 which had been previously decorticated.  Vancomycin powder was placed in deep wound space.  Wounds then irrigated one final time.   Hemostasis was assured with bipolar cautery.  Wounds were closed in layers with Vicryl sutures.  Steri-Strips and sterile dressing were applied.  No apparent complications.  Patient tolerated the procedure well and he returned to recovery room postop.

## 2020-12-13 NOTE — Anesthesia Postprocedure Evaluation (Signed)
Anesthesia Post Note  Patient: Rodney Harper  Procedure(s) Performed: POSTERIOR LUMBAR INTERBODY FUSION  - LUMBAR FOUR--LUMBAR FIVE - LUMBAR FIVE-SACRAL ONE (N/A Back)     Patient location during evaluation: PACU Anesthesia Type: General Level of consciousness: awake Pain management: pain level controlled Vital Signs Assessment: post-procedure vital signs reviewed and stable Respiratory status: spontaneous breathing Cardiovascular status: stable Postop Assessment: no apparent nausea or vomiting Anesthetic complications: no   No complications documented.  Last Vitals:  Vitals:   12/13/20 1345 12/13/20 1423  BP: 108/69 119/75  Pulse: 70 70  Resp: 16 18  Temp:    SpO2: 96% 96%    Last Pain:  Vitals:   12/13/20 1700  TempSrc:   PainSc: 9                  Lameshia Hypolite

## 2020-12-14 LAB — GLUCOSE, CAPILLARY
Glucose-Capillary: 124 mg/dL — ABNORMAL HIGH (ref 70–99)
Glucose-Capillary: 118 mg/dL — ABNORMAL HIGH (ref 70–99)
Glucose-Capillary: 156 mg/dL — ABNORMAL HIGH (ref 70–99)
Glucose-Capillary: 137 mg/dL — ABNORMAL HIGH (ref 70–99)

## 2020-12-14 MED ORDER — ETOMIDATE 2 MG/ML IV SOLN
INTRAVENOUS | Status: AC
  Filled 2020-12-14: qty 10

## 2020-12-14 MED FILL — Thrombin For Soln 20000 Unit: CUTANEOUS | Qty: 1 | Status: AC

## 2020-12-14 MED FILL — Sodium Chloride IV Soln 0.9%: INTRAVENOUS | Qty: 1000 | Status: AC

## 2020-12-14 MED FILL — Sodium Chloride Irrigation Soln 0.9%: Qty: 3000 | Status: AC

## 2020-12-14 MED FILL — Heparin Sodium (Porcine) Inj 1000 Unit/ML: INTRAMUSCULAR | Qty: 30 | Status: AC

## 2020-12-14 NOTE — Progress Notes (Signed)
OT evaluation:  PTA patient independent and driving. Admitted for above and limited by problem list below, including back precautions, pain and balance.  He was educated on back precautions, brace mgmt, ADL compensatory techniques, recommendations, DME and safety. He currently requires setup for UB ADLs, max assist for LB dressing, min assist for transfers and min guard for in room mobility using RW.  He will need 3:1 upon dc, as pt is highly reliant on UE support with transfers. Requires cueing to adhere to precautions, recall hand placement and ensure posture during session. His spouse plans to assist as needed.  He will benefit from further OT services while admitted and after dc at [facility] level to optimize independence, safety with ADLs, mobility.    12/14/20 0900  OT Visit Information  Last OT Received On 12/14/20  Assistance Needed +1  History of Present Illness Pt is a 65 y/o male S/P L4-5, L5-S1 decompressive laminotomies and foraminotomies.  PMH inclues: anemia, anxiety, benign essential HTN, COVID 19x2, pneumonia, DM2.  Precautions  Precautions Fall;Back  Precaution Booklet Issued Yes (comment)  Precaution Comments reviewed with pt, cueing to adhere functionally  Required Braces or Orthoses Spinal Brace  Spinal Brace Lumbar corset  Restrictions  Weight Bearing Restrictions No  Home Living  Family/patient expects to be discharged to: Private residence  Living Arrangements Spouse/significant other  Available Help at Discharge Family;Available 24 hours/day  Type of [place] to enter  Entrance Stairs-Number of Steps 2 with rail then 2 without  Home Layout One level  Animal nutritionist - standard;Shower seat - built in;Cane - single point  Prior Function  Level of Independence Independent with assistive device(s)  Comments driving, using cane for mobility, independent ADLs  Communication   Communication No difficulties  Pain Assessment  Pain Assessment Faces  Faces Pain Scale 4  Pain Location back incisional, post B knees  Pain Descriptors / Indicators Discomfort;Operative site guarding  Pain Intervention(s) Limited activity within patient's tolerance;Monitored during session;Repositioned  Cognition  Arousal/Alertness Awake/alert  Behavior During Therapy WFL for tasks assessed/performed  Overall Cognitive Status Within Functional Limits for tasks assessed  General Comments min cueing to adhere to precautions but good recall, verbose and requires redirection to task but anticipate baseline  Upper Extremity Assessment  Upper Extremity Assessment Overall WFL for tasks assessed  Lower Extremity Assessment  Lower Extremity Assessment Defer to PT evaluation  Cervical / Trunk Assessment  Cervical / Trunk Assessment Other exceptions  Cervical / Trunk Exceptions s/p back surgery  ADL  Overall ADL's  Needs assistance/impaired  Grooming Minimal assistance;Standing  Upper Body Bathing Set up;Sitting  Lower Body Bathing Minimal assistance;Sit to/from stand;Cueing for compensatory techniques;Cueing for back precautions  Upper Body Dressing  Set up;Sitting  Lower Body Dressing Maximal assistance;Sit to/from stand;Cueing for compensatory techniques;Cueing for back precautions  Lower Body Dressing Details (indicate cue type and reason) requires assist with threading pants, donning socks and shoes; able to pull pants over hips. Unable to complete figure 4 technique. Spouse plans to assist.  Min assist sit to stand.  Toilet Transfer Minimal assistance;Ambulation;RW;Grab bars;Comfort height toilet  Toileting- Clothing Manipulation and Hygiene Minimal assistance;Sit to/from stand;Cueing for back precautions  Functional mobility during ADLs Rolling walker;Min guard;Minimal assistance  General ADL Comments pt educated on precautions, ADL compensatory techniques, recommendations and  DME/safety.  Bed Mobility  Overal bed mobility Needs Assistance  Bed Mobility Rolling;Sidelying to Sit  Rolling Supervision  Sidelying to sit Min assist  General bed mobility comments min assist for trunk elevation, HOB elevated and heavy use of rail  Transfers  Overall transfer level Needs assistance  Equipment used Rolling walker (2 wheeled)  Transfers Sit to/from Stand  Sit to Stand Min assist  General transfer comment min assist to power up and steady, cueing for hand placement and posture throughout. increased time required  Balance  Overall balance assessment Needs assistance  Sitting-balance support No upper extremity supported;Feet supported  Sitting balance-Leahy Scale Fair  Standing balance support Bilateral upper extremity supported;During functional activity  Standing balance-Leahy Scale Poor  Standing balance comment relies on UE support  General Comments  General comments (skin integrity, edema, etc.) spouse present and supportive  OT - End of Session  Equipment Utilized During Treatment Gait belt;Rolling walker;Back brace  Activity Tolerance Patient tolerated treatment well  Patient left with call bell/phone within reach;with family/visitor present;Other (comment) (seated EOB)  Nurse Communication Mobility status  OT Assessment  OT Recommendation/Assessment Patient needs continued OT Services  OT Visit Diagnosis Other abnormalities of gait and mobility (R26.89);Muscle weakness (generalized) (M62.81);Pain  Pain - part of body  (back, posterior knees)  OT Problem List Decreased strength;Impaired balance (sitting and/or standing);Decreased activity tolerance;Decreased safety awareness;Decreased knowledge of use of DME or AE;Decreased knowledge of precautions;Pain  OT Plan  OT Frequency (ACUTE ONLY) Min 2X/week  OT Treatment/Interventions (ACUTE ONLY) Self-care/ADL training;DME and/or AE instruction;Therapeutic activities;Patient/family education;Balance training   AM-PAC OT "6 Clicks" Daily Activity Outcome Measure (Version 2)  Help from another person eating meals? 4  Help from another person taking care of personal grooming? 3  Help from another person toileting, which includes using toliet, bedpan, or urinal? 3  Help from another person bathing (including washing, rinsing, drying)? 3  Help from another person to put on and taking off regular upper body clothing? 3  Help from another person to put on and taking off regular lower body clothing? 2  6 Click Score 18  OT Recommendation  Recommendations for Other Services PT consult  Follow Up Recommendations Home health OT;Supervision/Assistance - 24 hour  OT Equipment 3 in 1 bedside commode  Individuals Consulted  Consulted and Agree with Results and Recommendations Patient  Acute Rehab OT Goals  Patient Stated Goal less pain  OT Goal Formulation With patient  Time For Goal Achievement 12/28/20  Potential to Achieve Goals Good  OT Time Calculation  OT Start Time (ACUTE ONLY) 0831  OT Stop Time (ACUTE ONLY) 0903  OT Time Calculation (min) 32 min  OT General Charges  $OT Visit 1 Visit  OT Evaluation  $OT Eval Moderate Complexity 1 Mod  OT Treatments  $Self Care/Home Management  8-22 mins   Jolaine Artist, OT Acute Rehabilitation Services Pager [phone] Office [phone]

## 2020-12-14 NOTE — Progress Notes (Signed)
Postop day 1.  Patient with a significant amount of incisional pain and some intermittent pain into the posterior aspects of both knees.  No new symptoms of numbness, paresthesias or weakness.  Patient on long-term narcotic medications preoperatively and pain control postoperatively has been difficult.  Afebrile.  Vital signs are stable.  Voiding well.  He is awake and alert.  He obviously is uncomfortable.  Motor and sensory function are intact.  Straight raising negative bilaterally.  Abdomen soft.  Overall progressing well.  Continue efforts at mobilization and pain control.  Hopefully home tomorrow.

## 2020-12-14 NOTE — Evaluation (Signed)
Physical Therapy Evaluation Patient Details Name: Rodney Harper MRN: [id number] DOB: 1956-06-09 Today's Date: 12/14/2020   History of Present Illness  Pt is a 65 y/o male admitted s/p L4-S1 decompressive laminotomies and foraminotomies on 12/13/2020.  PMH inclues: anemia, anxiety, benign essential HTN, COVID-19 x2, PNA, DM2.  Clinical Impression  Pt admitted with above diagnosis. At the time of PT eval, pt was able to demonstrate transfers and ambulation with gross min guard assist to min assist and RW for support. Pt has been using a cane for mobility at home, however feel the RW will be a safer option for him immediately following d/c. Pt and wife were educated on precautions, brace application/wearing schedule, appropriate activity progression, and car transfer. Pt currently with functional limitations due to the deficits listed below (see PT Problem List). Pt will benefit from skilled PT to increase their independence and safety with mobility to allow discharge to the venue listed below.      Follow Up Recommendations Home health PT;Supervision for mobility/OOB    Equipment Recommendations  Rolling walker with 5" wheels    Recommendations for Other Services       Precautions / Restrictions Precautions Precautions: Fall;Back Precaution Booklet Issued: Yes (comment) Precaution Comments: Reviewed handout with pt and he was cued for precautions during functional mobility. Required Braces or Orthoses: Spinal Brace Spinal Brace: Lumbar corset Restrictions Weight Bearing Restrictions: No      Mobility  Bed Mobility Overal bed mobility: Needs Assistance Bed Mobility: Rolling;Sidelying to Sit;Sit to Sidelying Rolling: Min guard;Supervision Sidelying to sit: Min assist;Min guard     Sit to sidelying: Min assist General bed mobility comments: HOB elevated and rails lowered to simulate home environment. Assist for LE elevation back into bed, and hands on guarding for maintenance of  precautions and proper log roll technique throughout bed mobility.    Transfers Overall transfer level: Needs assistance Equipment used: Rolling walker (2 wheeled) Transfers: Sit to/from Stand Sit to Stand: Min assist         General transfer comment: VC's for hand placement on seated surface for safety however pt still wanting to pull up from walker. Assist for power-up to full stand and to gain/maintain balance in standing. It appeared that bilateral knees were buckling in standing prior to initiating ambulation - improved with time.  Ambulation/Gait Ambulation/Gait assistance: Min guard Gait Distance (Feet): 200 Feet Assistive device: Rolling walker (2 wheeled) Gait Pattern/deviations: Step-through pattern;Decreased stride length;Trunk flexed Gait velocity: Decreased Gait velocity interpretation: <1.8 ft/sec, indicate of risk for recurrent falls General Gait Details: VC's for improved posture, closer walker proximity, forward gaze, and to roll walker, not pick it up to advance.  Stairs Stairs: Yes Stairs assistance: Min assist Stair Management: Two rails;Step to pattern;Forwards Number of Stairs: 4 (2x2) General stair comments: VC's for sequencing and general safety. Min assist required for balance support and safety.  Wheelchair Mobility    Modified Rankin (Stroke Patients Only)       Balance Overall balance assessment: Needs assistance Sitting-balance support: No upper extremity supported;Feet supported Sitting balance-Leahy Scale: Fair     Standing balance support: Bilateral upper extremity supported;During functional activity Standing balance-Leahy Scale: Poor Standing balance comment: relies on UE support                             Pertinent Vitals/Pain Pain Assessment: Faces Faces Pain Scale: Hurts little more Pain Location: back incisional, posterior B knees Pain Descriptors /  Indicators: Discomfort;Operative site guarding;Stabbing Pain  Intervention(s): Limited activity within patient's tolerance;Monitored during session;Repositioned    Home Living Family/patient expects to be discharged to:: Private residence Living Arrangements: Spouse/significant other Available Help at Discharge: Family;Available 24 hours/day Type of Home: House Home Access: Stairs to enter   [employer] of Steps: 2 with rail at front door, 2 without rail at car port. 2 without rails inside from den to main level. Home Layout: One level Home Equipment: Walker - standard;Shower seat - built in;Cane - single point      Prior Function Level of Independence: Independent with assistive device(s)         Comments: driving, using cane for mobility, independent ADLs     Hand Dominance        Extremity/Trunk Assessment   Upper Extremity Assessment Upper Extremity Assessment: Overall WFL for tasks assessed    Lower Extremity Assessment Lower Extremity Assessment: Generalized weakness (Bilateral "stabbing" pain in the back of bilateral knees. Pt reports this is new since surgery.)    Cervical / Trunk Assessment Cervical / Trunk Assessment: Other exceptions Cervical / Trunk Exceptions: s/p back surgery  Communication   Communication: No difficulties  Cognition Arousal/Alertness: Awake/alert Behavior During Therapy: WFL for tasks assessed/performed Overall Cognitive Status: Within Functional Limits for tasks assessed                                 General Comments: verbose and requires redirection to task but anticipate this is baseline      General Comments General comments (skin integrity, edema, etc.): spouse present and supportive    Exercises     Assessment/Plan    PT Assessment Patient needs continued PT services  PT Problem List Decreased strength;Decreased activity tolerance;Decreased balance;Decreased mobility;Decreased knowledge of use of DME;Decreased safety awareness;Decreased knowledge of  precautions;Pain       PT Treatment Interventions DME instruction;Gait training;Stair training;Functional mobility training;Therapeutic activities;Therapeutic exercise;Neuromuscular re-education;Patient/family education    PT Goals (Current goals can be found in the Care Plan section)  Acute Rehab PT Goals Patient Stated Goal: Decrease bilateral knee pain, be able to walk outside PT Goal Formulation: With patient/family Time For Goal Achievement: 12/21/20 Potential to Achieve Goals: Good    Frequency Min 5X/week   Barriers to discharge        Co-evaluation               AM-PAC PT "6 Clicks" Mobility  Outcome Measure Help needed turning from your back to your side while in a flat bed without using bedrails?: A Little Help needed moving from lying on your back to sitting on the side of a flat bed without using bedrails?: A Little Help needed moving to and from a bed to a chair (including a wheelchair)?: A Little Help needed standing up from a chair using your arms (e.g., wheelchair or bedside chair)?: A Little Help needed to walk in hospital room?: A Little Help needed climbing 3-5 steps with a railing? : A Little 6 Click Score: 18    End of Session Equipment Utilized During Treatment: Gait belt;Back brace Activity Tolerance: Patient tolerated treatment well Patient left: in bed;with call bell/phone within reach;with family/visitor present Nurse Communication: Mobility status PT Visit Diagnosis: Unsteadiness on feet (R26.81);Pain Pain - part of body:  (bilateral knees; back)    Time: 1610-9604 PT Time Calculation (min) (ACUTE ONLY): 39 min   Charges:   PT Evaluation $PT Eval  Low Complexity: 1 Low PT Treatments $Gait Training: 23-37 mins        Rolinda Roan, PT, DPT Acute Rehabilitation Services Pager: [phone] Office: [phone]   Thelma Comp 12/14/2020, 10:47 AM

## 2020-12-15 LAB — GLUCOSE, CAPILLARY: Glucose-Capillary: 121 mg/dL — ABNORMAL HIGH (ref 70–99)

## 2020-12-15 MED ORDER — OXYCODONE HCL 10 MG PO TABS: 10.0000 mg | ORAL_TABLET | ORAL | 0 refills | Status: AC | PRN

## 2020-12-15 MED ORDER — DIAZEPAM 5 MG PO TABS
5.0000 mg | ORAL_TABLET | Freq: Four times a day (QID) | ORAL | 0 refills | Status: AC | PRN
Start: 2020-12-15 — End: ?

## 2020-12-15 NOTE — Progress Notes (Signed)
Physical Therapy Treatment Patient Details Name: Rodney Harper MRN: [id number] DOB: November 03, 1955 Today's Date: 12/15/2020    History of Present Illness Pt is a 65 y/o male admitted s/p L4-S1 decompressive laminotomies and foraminotomies on 12/13/2020.  PMH inclues: anemia, anxiety, benign essential HTN, COVID-19 x2, PNA, DM2.    PT Comments    Pt progressing well with post-op mobility. He was able to demonstrate transfers and ambulation with gross min guard assist and RW for support. Pt did utilize [place] on the stairs as he does not have railings at home. Pt was educated on precautions, brace application/wearing schedule, appropriate activity progression, and car transfer. Will continue to follow.      Follow Up Recommendations  Home health PT;Supervision for mobility/OOB     Equipment Recommendations  Rolling walker with 5" wheels    Recommendations for Other Services       Precautions / Restrictions Precautions Precautions: Fall;Back Precaution Booklet Issued: Yes (comment) Precaution Comments: Frequent cues required throughout session to maintain precautions. Required Braces or Orthoses: Spinal Brace Spinal Brace: Lumbar corset;Applied in sitting position Restrictions Weight Bearing Restrictions: No    Mobility  Bed Mobility Overal bed mobility: Needs Assistance Bed Mobility: Rolling;Sidelying to Sit;Sit to Sidelying Rolling: Supervision Sidelying to sit: Min guard;HOB elevated     Sit to sidelying: Min guard General bed mobility comments: Pt sitting up on EOB when PT arrived.    Transfers Overall transfer level: Needs assistance Equipment used: Rolling walker (2 wheeled) Transfers: Sit to/from Stand Sit to Stand: Min guard         General transfer comment: VC's for hand placement on seated surface for safety. No assist required to power-up to full stand however pt required hands on guarding for safety.  Ambulation/Gait Ambulation/Gait assistance: Min  guard Gait Distance (Feet): 200 Feet Assistive device: Rolling walker (2 wheeled) Gait Pattern/deviations: Step-through pattern;Decreased stride length;Trunk flexed Gait velocity: Decreased Gait velocity interpretation: <1.8 ft/sec, indicate of risk for recurrent falls General Gait Details: VC's for improved posture, closer walker proximity, forward gaze, and to roll walker, not pick it up to advance.   Stairs Stairs: Yes Stairs assistance: Min assist Stair Management: Two rails;Step to pattern;Forwards Number of Stairs: 6 (2 x3) General stair comments: VC's for sequencing and general safety. Wife present and instructed in proper guarding technique. SPC utilized as well as HHA for safety.   Wheelchair Mobility    Modified Rankin (Stroke Patients Only)       Balance Overall balance assessment: Needs assistance Sitting-balance support: No upper extremity supported;Feet supported Sitting balance-Leahy Scale: Fair     Standing balance support: Bilateral upper extremity supported;During functional activity Standing balance-Leahy Scale: Poor Standing balance comment: relies on BUE support                            Cognition Arousal/Alertness: Awake/alert Behavior During Therapy: WFL for tasks assessed/performed Overall Cognitive Status: Within Functional Limits for tasks assessed                                 General Comments: increased time to recall back precautions, but no cueing required      Exercises      General Comments        Pertinent Vitals/Pain Pain Assessment: Faces Faces Pain Scale: Hurts little more Pain Location: incisional back Pain Descriptors / Indicators: Discomfort;Operative site guarding;Stabbing Pain Intervention(s):  Limited activity within patient's tolerance;Monitored during session;Repositioned    Home Living                      Prior Function            PT Goals (current goals can now be found  in the care plan section) Acute Rehab PT Goals Patient Stated Goal: home today PT Goal Formulation: With patient/family Time For Goal Achievement: 12/21/20 Potential to Achieve Goals: Good Progress towards PT goals: Progressing toward goals    Frequency    Min 5X/week      PT Plan Current plan remains appropriate    Co-evaluation              AM-PAC PT "6 Clicks" Mobility   Outcome Measure  Help needed turning from your back to your side while in a flat bed without using bedrails?: A Little Help needed moving from lying on your back to sitting on the side of a flat bed without using bedrails?: A Little Help needed moving to and from a bed to a chair (including a wheelchair)?: A Little Help needed standing up from a chair using your arms (e.g., wheelchair or bedside chair)?: A Little Help needed to walk in hospital room?: A Little Help needed climbing 3-5 steps with a railing? : A Little 6 Click Score: 18    End of Session Equipment Utilized During Treatment: Gait belt;Back brace Activity Tolerance: Patient tolerated treatment well Patient left: in bed;with call bell/phone within reach;with family/visitor present Nurse Communication: Mobility status PT Visit Diagnosis: Unsteadiness on feet (R26.81);Pain Pain - part of body:  (bilateral knees; back)     Time: 5409-8119 PT Time Calculation (min) (ACUTE ONLY): 26 min  Charges:  $Gait Training: 23-37 mins                     Rolinda Roan, PT, DPT Acute Rehabilitation Services Pager: [phone] Office: [phone]    Thelma Comp 12/15/2020, 11:07 AM

## 2020-12-15 NOTE — TOC Transition Note (Signed)
Transition of Care [facility]) - CM/SW Discharge Note   Patient Details  Name: Rodney Harper MRN: [id number] Date of Birth: Dec 21, 1955  Transition of Care [facility]) CM/SW Contact:  Angelita Ingles, RN Phone Number: [phone]  12/15/2020, 9:51 AM   Clinical Narrative:    [facility] consulted for patient [place] needs. Patients wife requested [facility] as first choice for home health. [facility] is unable to accept patient for [facility] services. CM has attempted to set up services through [facility], [place] , [place], [place], [place] , [facility] and [facility] no agency is able to accept. CM at bedside to make patient aware and to determine if patient is a likely candidate for outpatient HH.Patient states that he does not want outpatient HH because his MD has told him that he should not do therapy right away. Patient and wife both agree that patient will be ok at home without therapy. Wife confirms that she will be with patient. Bedside nurse has been updated. No other needs noted at this time. TOC will sign off.   Final next level of care: Home/Self Care Barriers to Discharge: No [place] will accept this patient (unable to set up home health)   Patient Goals and CMS Choice Patient states their goals for this hospitalization and ongoing recovery are:: Ready to go home CMS Medicare.gov Compare Post Acute Care list provided to:: Patient Choice offered to / list presented to : Patient  Discharge Placement                       Discharge Plan and Services In-house Referral:  (n/a) Discharge Planning Services: CM Consult Post Acute Care Choice: Home Health          DME Arranged: N/A         HH Arranged: NA HH Agency: NA        Social Determinants of Health (SDOH) Interventions     Readmission Risk Interventions No flowsheet data found.

## 2020-12-15 NOTE — Progress Notes (Signed)
Patient is discharged from room 3C10 at this time. Alert and in stable condition. IV site d/c'd and instructions read to patient and spouse with understanding verbalized and all question answered. Left unit via wheelchair with all belongings at side.

## 2020-12-15 NOTE — Discharge Summary (Signed)
Physician Discharge Summary  Patient ID: Rodney Harper MRN: [id number] DOB/AGE: [birth year] 65 y.o.  Admit date: 12/13/2020 Discharge date: 12/15/2020  Admission Diagnoses:  Discharge Diagnoses:  Active Problems:   Lumbar foraminal stenosis   Discharged Condition: good  Hospital Course: Patient admitted to the hospital where he underwent uncomplicated two-level lumbar decompression and fusion.  Postoperatively he is progressing well.  His back pain is well controlled.  He is standing and walking well.  He has no new symptoms of numbness or weakness.  Having no wound issues.  He is voiding well.  Consults:   Significant Diagnostic Studies:   Treatments:   Discharge Exam: Blood pressure (!) 117/57, pulse 94, temperature 97.7 F (36.5 C), temperature source Oral, resp. rate 18, height 5\' 10"  (1.778 m), weight 93 kg, SpO2 94 %. Awake and alert.  Oriented and appropriate.  Motor and sensory function intact.  Wound clean and dry.  Chest and abdomen benign.  Disposition: Discharge disposition: 01-Home or Self Care        Allergies as of 12/15/2020      Reactions   Carbamazepine Other (See Comments)   Unknown   Clonidine Other (See Comments)   Cause to have all of his teeth pulled and finger nail came off Reaction internally instead of externally      Medication List    STOP taking these medications   oxyCODONE-acetaminophen 10-325 MG tablet Commonly known as: PERCOCET     TAKE these medications   acetaminophen 500 MG tablet Commonly known as: TYLENOL Take 1,000 mg by mouth every 6 (six) hours as needed for moderate pain or mild pain.   Adult One Daily Gummies Chew Chew 1 tablet by mouth daily. CBD   ALPRAZolam 0.5 MG tablet Commonly known as: XANAX Take 0.5 mg by mouth at bedtime.   baclofen 10 MG tablet Commonly known as: LIORESAL Take 10 mg by mouth 3 (three) times daily.   Clear Eyes Cooling Comfort 0.012-0.25-0.25 % Soln Generic drug:  Naphazoline-Glycerin-Zinc Sulf Place 1 drop into both eyes daily as needed (eye irritation).   diazepam 5 MG tablet Commonly known as: VALIUM Take 1-2 tablets (5-10 mg total) by mouth every 6 (six) hours as needed for muscle spasms.   escitalopram 20 MG tablet Commonly known as: LEXAPRO Take 20 mg by mouth at bedtime.   ezetimibe 10 MG tablet Commonly known as: ZETIA Take 10 mg by mouth at bedtime.   famotidine 20 MG tablet Commonly known as: PEPCID Take 20 mg by mouth 2 (two) times daily.   furosemide 20 MG tablet Commonly known as: LASIX Take 20 mg by mouth daily.   losartan-hydrochlorothiazide 50-12.5 MG tablet Commonly known as: HYZAAR Take 1 tablet by mouth daily.   metFORMIN 500 MG 24 hr tablet Commonly known as: GLUCOPHAGE-XR Take 500 mg by mouth 2 (two) times a day.   metoprolol succinate 100 MG 24 hr tablet Commonly known as: TOPROL-XL Take 100 mg by mouth at bedtime.   naloxone 0.4 MG/ML injection Commonly known as: NARCAN Inject 0.4 mg into the vein as needed (over dose).   Oxycodone HCl 10 MG Tabs Take 1 tablet (10 mg total) by mouth every 3 (three) hours as needed for severe pain ((score 7 to 10)).   potassium chloride 10 MEQ tablet Commonly known as: KLOR-CON Take 10 mEq by mouth daily.   pregabalin 75 MG capsule Commonly known as: LYRICA Take 75 mg by mouth See admin instructions. Take 75 mg in the morning and  evening and 150 mg at bedtime   rOPINIRole 0.25 MG tablet Commonly known as: REQUIP Take 0.5 tablets by mouth at bedtime.   tadalafil 5 MG tablet Commonly known as: CIALIS Take 5 mg by mouth daily as needed for erectile dysfunction.   terazosin 2 MG capsule Commonly known as: HYTRIN Take 2 mg by mouth daily.   testosterone cypionate 200 MG/ML injection Commonly known as: DEPOTESTOSTERONE CYPIONATE Inject 200 mg into the muscle every 14 (fourteen) days.   vitamin B-12 1000 MCG tablet Commonly known as: CYANOCOBALAMIN Take 1,000  mcg by mouth daily.   Vitamin D-1000 Max St 25 MCG (1000 UT) tablet Generic drug: Cholecalciferol Take 1,000 Units by mouth daily.            Durable Medical Equipment  (From admission, onward)         Start     Ordered   12/13/20 1424  DME Walker rolling  Once       Question:  Patient needs a walker to treat with the following condition  Answer:  Lumbar foraminal stenosis   12/13/20 1423   12/13/20 1424  DME 3 n 1  Once        12/13/20 1423           Signed: Cooper Render Remell Giaimo 12/15/2020, 8:38 AM

## 2020-12-15 NOTE — Discharge Instructions (Signed)

## 2020-12-15 NOTE — Progress Notes (Signed)
Occupational Therapy Treatment Patient Details Name: Rodney Harper MRN: [id number] DOB: [birth year] Today's Date: 12/15/2020    History of present illness Pt is a 65 y/o male admitted s/p L4-S1 decompressive laminotomies and foraminotomies on 12/13/2020.  PMH inclues: anemia, anxiety, benign essential HTN, COVID-19 x2, PNA, DM2.   OT comments  Patient supine in bed and agreeable to OT. Session focused on shower transfers, reviewed LB self care.  Patient with good recall of precautions but min cueing to adhere functionally.  Patient continues to require min guard for transfers, cueing for hand placement, sequencing, and body mechanics.  Simulated shower transfers with min guard assist, taking RW into shower until patient gets grabbars installed so he can safely get to built in shower chair.  Patient continues to plan on having spouse assist with LB self care.  Continue to recommend [facility] services at this time.  Will follow acutely.    Follow Up Recommendations  Home health OT;Supervision/Assistance - 24 hour    Equipment Recommendations  3 in 1 bedside commode    Recommendations for Other Services      Precautions / Restrictions Precautions Precautions: Fall;Back Precaution Booklet Issued: Yes (comment) Precaution Comments: min cueing to adhere functionally Required Braces or Orthoses: Spinal Brace Spinal Brace: Lumbar corset;Applied in sitting position Restrictions Weight Bearing Restrictions: No       Mobility Bed Mobility Overal bed mobility: Needs Assistance Bed Mobility: Rolling;Sidelying to Sit;Sit to Sidelying Rolling: Supervision Sidelying to sit: Min guard;HOB elevated     Sit to sidelying: Min guard General bed mobility comments: HOB slightly elevated, no use of rails; min guard with increased time and cueing to adhere to technique    Transfers Overall transfer level: Needs assistance Equipment used: Rolling walker (2 wheeled) Transfers: Sit to/from Stand Sit to  Stand: Min guard         General transfer comment: cueing for hand placement and safety, min guard to power up and steady    Balance Overall balance assessment: Needs assistance Sitting-balance support: No upper extremity supported;Feet supported Sitting balance-Leahy Scale: Fair     Standing balance support: Bilateral upper extremity supported;During functional activity Standing balance-Leahy Scale: Poor Standing balance comment: relies on BUE support                           ADL either performed or assessed with clinical judgement   ADL Overall ADL's : Needs assistance/impaired             Lower Body Bathing: Minimal assistance;Sit to/from stand Lower Body Bathing Details (indicate cue type and reason): reviewed technique seated, using long sponge or spouse assisting with LB Upper Body Dressing : Set up;Sitting Upper Body Dressing Details (indicate cue type and reason): able to don brace without assist     Toilet Transfer: Min guard;Ambulation;RW Toilet Transfer Details (indicate cue type and reason): simulated to chair     Tub/ Shower Transfer: Walk-in shower;Min guard;Ambulation;Shower seat;Cueing for safety;Cueing for sequencing;Adhering to back precautions;Rolling walker Tub/Shower Transfer Details (indicate cue type and reason): simulated in room, discussed options and plans to take walker into shower and have spouse remove after sitting; min guard for safety Functional mobility during ADLs: Min guard;Rolling walker General ADL Comments: good recall of precautions, continues to require assist for LB self care     Vision       Perception     Praxis      Cognition Arousal/Alertness: Awake/alert Behavior During Therapy:  WFL for tasks assessed/performed Overall Cognitive Status: Within Functional Limits for tasks assessed                                 General Comments: increased time to recall back precautions, but no cueing  required        Exercises     Shoulder Instructions       General Comments      Pertinent Vitals/ Pain       Pain Assessment: Faces Faces Pain Scale: Hurts little more Pain Location: incisional back Pain Descriptors / Indicators: Discomfort;Operative site guarding;Stabbing Pain Intervention(s): Limited activity within patient's tolerance;Monitored during session;Repositioned  Home Living                                          Prior Functioning/Environment              Frequency  Min 2X/week        Progress Toward Goals  OT Goals(current goals can now be found in the care plan section)  Progress towards OT goals: Progressing toward goals  Acute Rehab OT Goals Patient Stated Goal: home today OT Goal Formulation: With patient  Plan Discharge plan remains appropriate;Frequency remains appropriate    Co-evaluation                 AM-PAC OT "6 Clicks" Daily Activity     Outcome Measure   Help from another person eating meals?: None Help from another person taking care of personal grooming?: A Little Help from another person toileting, which includes using toliet, bedpan, or urinal?: A Little Help from another person bathing (including washing, rinsing, drying)?: A Little Help from another person to put on and taking off regular upper body clothing?: A Little Help from another person to put on and taking off regular lower body clothing?: A Lot 6 Click Score: 18    End of Session Equipment Utilized During Treatment: Gait belt;Rolling walker;Back brace  OT Visit Diagnosis: Other abnormalities of gait and mobility (R26.89);Muscle weakness (generalized) (M62.81);Pain Pain - part of body:  (back)   Activity Tolerance Patient tolerated treatment well   Patient Left in bed;with call bell/phone within reach   Nurse Communication Mobility status        Time: 7989-2119 OT Time Calculation (min): 19 min  Charges: OT General  Charges $OT Visit: 1 Visit OT Treatments $Self Care/Home Management : 8-22 mins  Jolaine Artist, [place] Pager [phone] Office [phone]    Delight Stare 12/15/2020, 10:11 AM

## 2020-12-22 DIAGNOSIS — R319 Hematuria, unspecified: Secondary | ICD-10-CM | POA: Diagnosis not present

## 2020-12-22 DIAGNOSIS — D500 Iron deficiency anemia secondary to blood loss (chronic): Secondary | ICD-10-CM | POA: Diagnosis not present

## 2021-01-25 DIAGNOSIS — M5416 Radiculopathy, lumbar region: Secondary | ICD-10-CM | POA: Diagnosis not present

## 2021-02-20 DIAGNOSIS — E291 Testicular hypofunction: Secondary | ICD-10-CM | POA: Diagnosis not present

## 2021-02-22 DIAGNOSIS — M5416 Radiculopathy, lumbar region: Secondary | ICD-10-CM | POA: Diagnosis not present

## 2021-03-06 DIAGNOSIS — E291 Testicular hypofunction: Secondary | ICD-10-CM | POA: Diagnosis not present

## 2021-03-20 DIAGNOSIS — E291 Testicular hypofunction: Secondary | ICD-10-CM | POA: Diagnosis not present

## 2021-03-22 DIAGNOSIS — M5416 Radiculopathy, lumbar region: Secondary | ICD-10-CM | POA: Diagnosis not present

## 2021-04-11 DIAGNOSIS — E291 Testicular hypofunction: Secondary | ICD-10-CM | POA: Diagnosis not present

## 2021-04-13 DIAGNOSIS — G4700 Insomnia, unspecified: Secondary | ICD-10-CM | POA: Diagnosis not present

## 2021-04-13 DIAGNOSIS — R251 Tremor, unspecified: Secondary | ICD-10-CM | POA: Diagnosis not present

## 2021-04-13 DIAGNOSIS — Z981 Arthrodesis status: Secondary | ICD-10-CM | POA: Diagnosis not present

## 2021-04-13 DIAGNOSIS — R413 Other amnesia: Secondary | ICD-10-CM | POA: Diagnosis not present

## 2021-04-13 DIAGNOSIS — M5126 Other intervertebral disc displacement, lumbar region: Secondary | ICD-10-CM | POA: Diagnosis not present

## 2021-04-13 DIAGNOSIS — M5116 Intervertebral disc disorders with radiculopathy, lumbar region: Secondary | ICD-10-CM | POA: Diagnosis not present

## 2021-04-13 DIAGNOSIS — G40909 Epilepsy, unspecified, not intractable, without status epilepticus: Secondary | ICD-10-CM | POA: Diagnosis not present

## 2021-04-13 DIAGNOSIS — Z79899 Other long term (current) drug therapy: Secondary | ICD-10-CM | POA: Diagnosis not present

## 2021-04-25 DIAGNOSIS — E291 Testicular hypofunction: Secondary | ICD-10-CM | POA: Diagnosis not present

## 2021-05-09 DIAGNOSIS — G2581 Restless legs syndrome: Secondary | ICD-10-CM | POA: Diagnosis not present

## 2021-05-09 DIAGNOSIS — F419 Anxiety disorder, unspecified: Secondary | ICD-10-CM | POA: Diagnosis not present

## 2021-05-09 DIAGNOSIS — D500 Iron deficiency anemia secondary to blood loss (chronic): Secondary | ICD-10-CM | POA: Diagnosis not present

## 2021-05-09 DIAGNOSIS — I10 Essential (primary) hypertension: Secondary | ICD-10-CM | POA: Diagnosis not present

## 2021-05-09 DIAGNOSIS — Z79899 Other long term (current) drug therapy: Secondary | ICD-10-CM | POA: Diagnosis not present

## 2021-05-09 DIAGNOSIS — R5381 Other malaise: Secondary | ICD-10-CM | POA: Diagnosis not present

## 2021-05-09 DIAGNOSIS — E1142 Type 2 diabetes mellitus with diabetic polyneuropathy: Secondary | ICD-10-CM | POA: Diagnosis not present

## 2021-05-09 DIAGNOSIS — E6609 Other obesity due to excess calories: Secondary | ICD-10-CM | POA: Diagnosis not present

## 2021-05-09 DIAGNOSIS — Z6831 Body mass index (BMI) 31.0-31.9, adult: Secondary | ICD-10-CM | POA: Diagnosis not present

## 2021-05-09 DIAGNOSIS — R4189 Other symptoms and signs involving cognitive functions and awareness: Secondary | ICD-10-CM | POA: Diagnosis not present

## 2021-05-09 DIAGNOSIS — Z794 Long term (current) use of insulin: Secondary | ICD-10-CM | POA: Diagnosis not present

## 2021-05-09 DIAGNOSIS — E782 Mixed hyperlipidemia: Secondary | ICD-10-CM | POA: Diagnosis not present

## 2021-05-09 DIAGNOSIS — R5383 Other fatigue: Secondary | ICD-10-CM | POA: Diagnosis not present

## 2021-05-09 DIAGNOSIS — E291 Testicular hypofunction: Secondary | ICD-10-CM | POA: Diagnosis not present

## 2021-05-09 DIAGNOSIS — R350 Frequency of micturition: Secondary | ICD-10-CM | POA: Diagnosis not present

## 2021-05-09 DIAGNOSIS — R296 Repeated falls: Secondary | ICD-10-CM | POA: Diagnosis not present

## 2021-05-10 ENCOUNTER — Other Ambulatory Visit: Payer: Self-pay | Admitting: *Deleted

## 2021-05-10 DIAGNOSIS — R4189 Other symptoms and signs involving cognitive functions and awareness: Secondary | ICD-10-CM

## 2021-05-10 DIAGNOSIS — R296 Repeated falls: Secondary | ICD-10-CM

## 2021-05-10 DIAGNOSIS — R251 Tremor, unspecified: Secondary | ICD-10-CM

## 2021-05-12 ENCOUNTER — Other Ambulatory Visit: Payer: Self-pay | Admitting: Internal Medicine

## 2021-05-12 DIAGNOSIS — R251 Tremor, unspecified: Secondary | ICD-10-CM

## 2021-05-12 DIAGNOSIS — R296 Repeated falls: Secondary | ICD-10-CM

## 2021-05-12 DIAGNOSIS — R4189 Other symptoms and signs involving cognitive functions and awareness: Secondary | ICD-10-CM

## 2021-05-23 DIAGNOSIS — E291 Testicular hypofunction: Secondary | ICD-10-CM | POA: Diagnosis not present

## 2021-05-24 DIAGNOSIS — M48061 Spinal stenosis, lumbar region without neurogenic claudication: Secondary | ICD-10-CM | POA: Diagnosis not present

## 2021-05-25 ENCOUNTER — Other Ambulatory Visit: Payer: Self-pay | Admitting: Neurosurgery

## 2021-05-25 DIAGNOSIS — G959 Disease of spinal cord, unspecified: Secondary | ICD-10-CM

## 2021-05-28 ENCOUNTER — Ambulatory Visit
Admission: RE | Admit: 2021-05-28 | Discharge: 2021-05-28 | Disposition: A | Discharge status: Home / Self Care | Payer: PPO | Source: Ambulatory Visit | Attending: Internal Medicine | Admitting: Internal Medicine

## 2021-05-28 ENCOUNTER — Other Ambulatory Visit: Payer: Self-pay

## 2021-05-28 DIAGNOSIS — R296 Repeated falls: Secondary | ICD-10-CM

## 2021-05-28 DIAGNOSIS — R4189 Other symptoms and signs involving cognitive functions and awareness: Secondary | ICD-10-CM

## 2021-05-28 DIAGNOSIS — G3184 Mild cognitive impairment, so stated: Secondary | ICD-10-CM | POA: Diagnosis not present

## 2021-05-28 DIAGNOSIS — R251 Tremor, unspecified: Secondary | ICD-10-CM

## 2021-05-28 MED ORDER — GADOBENATE DIMEGLUMINE 529 MG/ML IV SOLN
19.0000 mL | Freq: Once | INTRAVENOUS | Status: AC | PRN
  Administered 2021-05-28: 19 mL via INTRAVENOUS

## 2021-06-01 ENCOUNTER — Ambulatory Visit
Admission: RE | Admit: 2021-06-01 | Discharge: 2021-06-01 | Disposition: A | Discharge status: Home / Self Care | Payer: PPO | Source: Ambulatory Visit | Attending: Neurosurgery | Admitting: Neurosurgery

## 2021-06-01 ENCOUNTER — Other Ambulatory Visit: Payer: Self-pay

## 2021-06-01 DIAGNOSIS — G959 Disease of spinal cord, unspecified: Secondary | ICD-10-CM

## 2021-06-01 DIAGNOSIS — M4802 Spinal stenosis, cervical region: Secondary | ICD-10-CM | POA: Diagnosis not present

## 2021-06-06 DIAGNOSIS — E291 Testicular hypofunction: Secondary | ICD-10-CM | POA: Diagnosis not present

## 2021-06-07 DIAGNOSIS — R251 Tremor, unspecified: Secondary | ICD-10-CM | POA: Diagnosis not present

## 2021-06-07 DIAGNOSIS — G9389 Other specified disorders of brain: Secondary | ICD-10-CM | POA: Diagnosis not present

## 2021-06-07 DIAGNOSIS — D500 Iron deficiency anemia secondary to blood loss (chronic): Secondary | ICD-10-CM | POA: Diagnosis not present

## 2021-06-07 DIAGNOSIS — I10 Essential (primary) hypertension: Secondary | ICD-10-CM | POA: Diagnosis not present

## 2021-06-07 DIAGNOSIS — R296 Repeated falls: Secondary | ICD-10-CM | POA: Diagnosis not present

## 2021-06-07 DIAGNOSIS — Z23 Encounter for immunization: Secondary | ICD-10-CM | POA: Diagnosis not present

## 2021-06-07 DIAGNOSIS — Z0000 Encounter for general adult medical examination without abnormal findings: Secondary | ICD-10-CM | POA: Diagnosis not present

## 2021-06-07 DIAGNOSIS — E1142 Type 2 diabetes mellitus with diabetic polyneuropathy: Secondary | ICD-10-CM | POA: Diagnosis not present

## 2021-06-07 DIAGNOSIS — F330 Major depressive disorder, recurrent, mild: Secondary | ICD-10-CM | POA: Diagnosis not present

## 2021-06-14 DIAGNOSIS — M48061 Spinal stenosis, lumbar region without neurogenic claudication: Secondary | ICD-10-CM | POA: Diagnosis not present

## 2021-06-21 DIAGNOSIS — E291 Testicular hypofunction: Secondary | ICD-10-CM | POA: Diagnosis not present

## 2021-06-27 DIAGNOSIS — D500 Iron deficiency anemia secondary to blood loss (chronic): Secondary | ICD-10-CM | POA: Diagnosis not present

## 2021-07-04 DIAGNOSIS — E291 Testicular hypofunction: Secondary | ICD-10-CM | POA: Diagnosis not present

## 2021-07-18 DIAGNOSIS — E291 Testicular hypofunction: Secondary | ICD-10-CM | POA: Diagnosis not present

## 2021-08-01 DIAGNOSIS — E291 Testicular hypofunction: Secondary | ICD-10-CM | POA: Diagnosis not present

## 2021-08-02 DIAGNOSIS — Z6832 Body mass index (BMI) 32.0-32.9, adult: Secondary | ICD-10-CM | POA: Diagnosis not present

## 2021-08-02 DIAGNOSIS — M48061 Spinal stenosis, lumbar region without neurogenic claudication: Secondary | ICD-10-CM | POA: Diagnosis not present

## 2021-08-05 IMAGING — RF DG LUMBAR SPINE 2-3V
1 series · 2 of 2 positions shown · non-contrast
Comparison: Lumbar spine MRI 11/20/2020.

CLINICAL DATA: Surgery, elective. Additional history provided:
L4-S1 PLIF. Provided fluoroscopy time: 50.5 seconds (45.88 mGy).

EXAM:
LUMBAR SPINE - 2-3 VIEW; DG C-ARM 1-60 MIN

[Series 1: run · 2 of 2 slices shown]
[im 1/2]
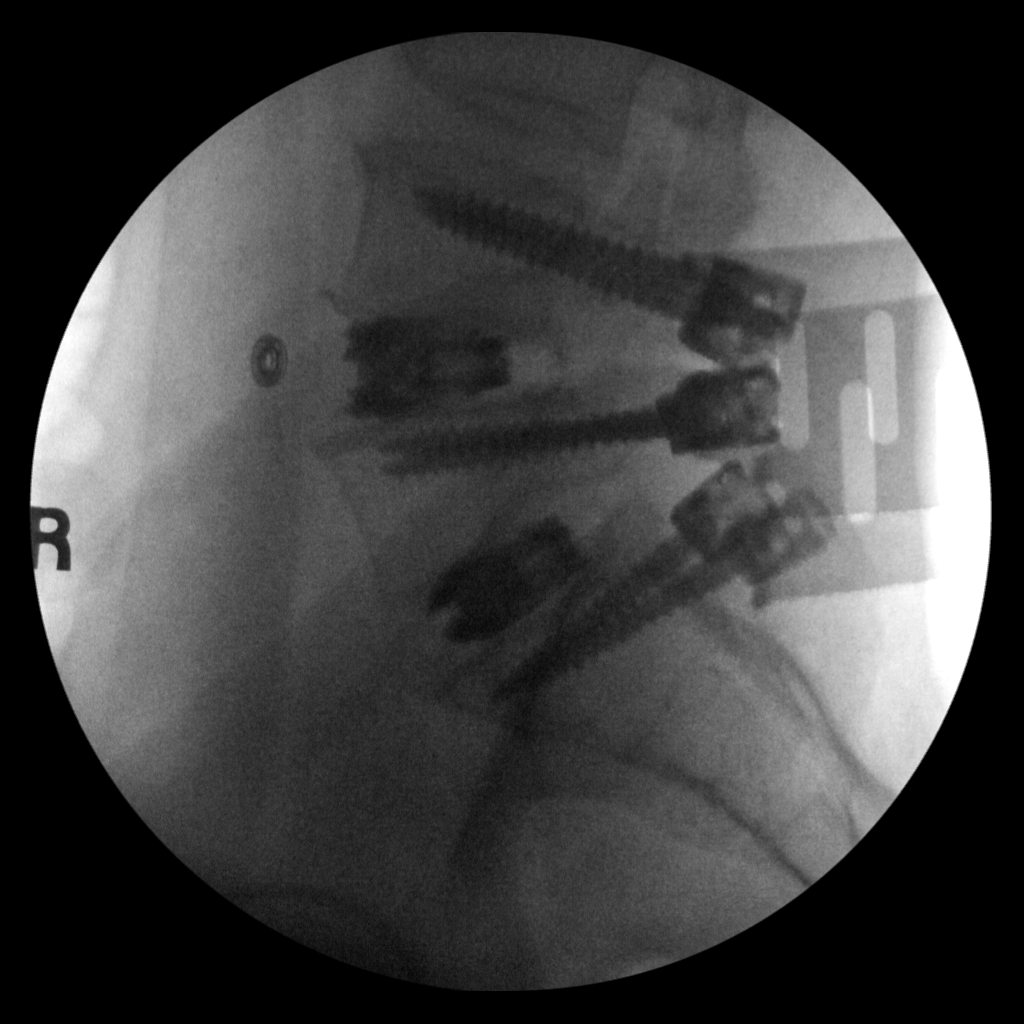
[im 2/2]
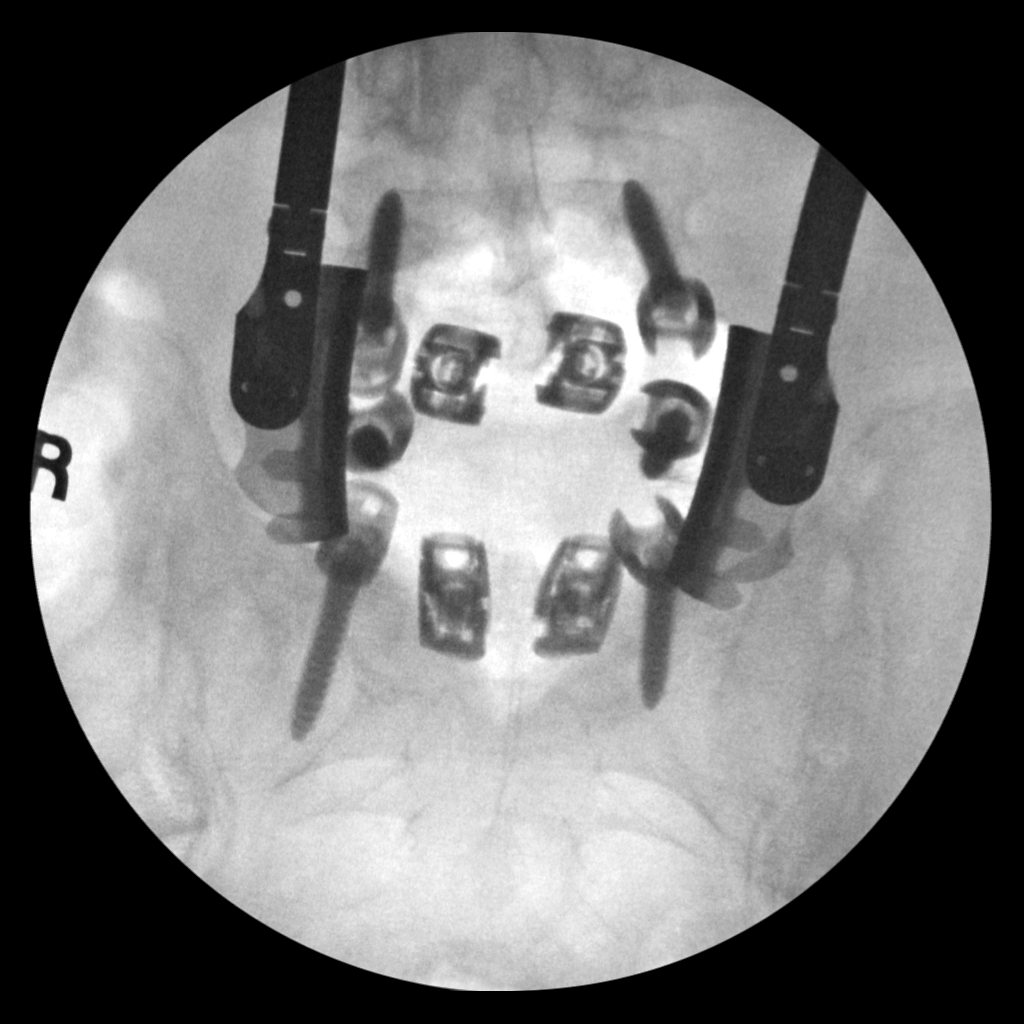

[2 of 2 positions shown; findings below may reference images not displayed]

FINDINGS: Frontal and lateral view intraoperative fluoroscopic images of the
lumbar spine are submitted, 2 images total. Spinal numbering will
remain consistent with that utilized on the prior lumbar spine MRI
of 11/20/2020. The lowest well-formed intervertebral disc space is
designated L5-S1. On the provided images, there are bilateral
pedicle screws at L4, L5 and S1. Vertical interconnecting rods were
not present at the time the images were taken. Interbody devices are
present at L4-L5 and L5-S1. Overlying retractors.
IMPRESSION: Two intraoperative fluoroscopic images of the lumbar spine from
L4-S1 fusion, as described.

## 2021-08-05 IMAGING — RF DG C-ARM 1-60 MIN
1 series · 2 of 2 positions shown · non-contrast
Comparison: Lumbar spine MRI 11/20/2020.

CLINICAL DATA: Surgery, elective. Additional history provided:
L4-S1 PLIF. Provided fluoroscopy time: 50.5 seconds (45.88 mGy).

EXAM:
LUMBAR SPINE - 2-3 VIEW; DG C-ARM 1-60 MIN

[Series 1: run · 2 of 2 slices shown]
[im 1/2]
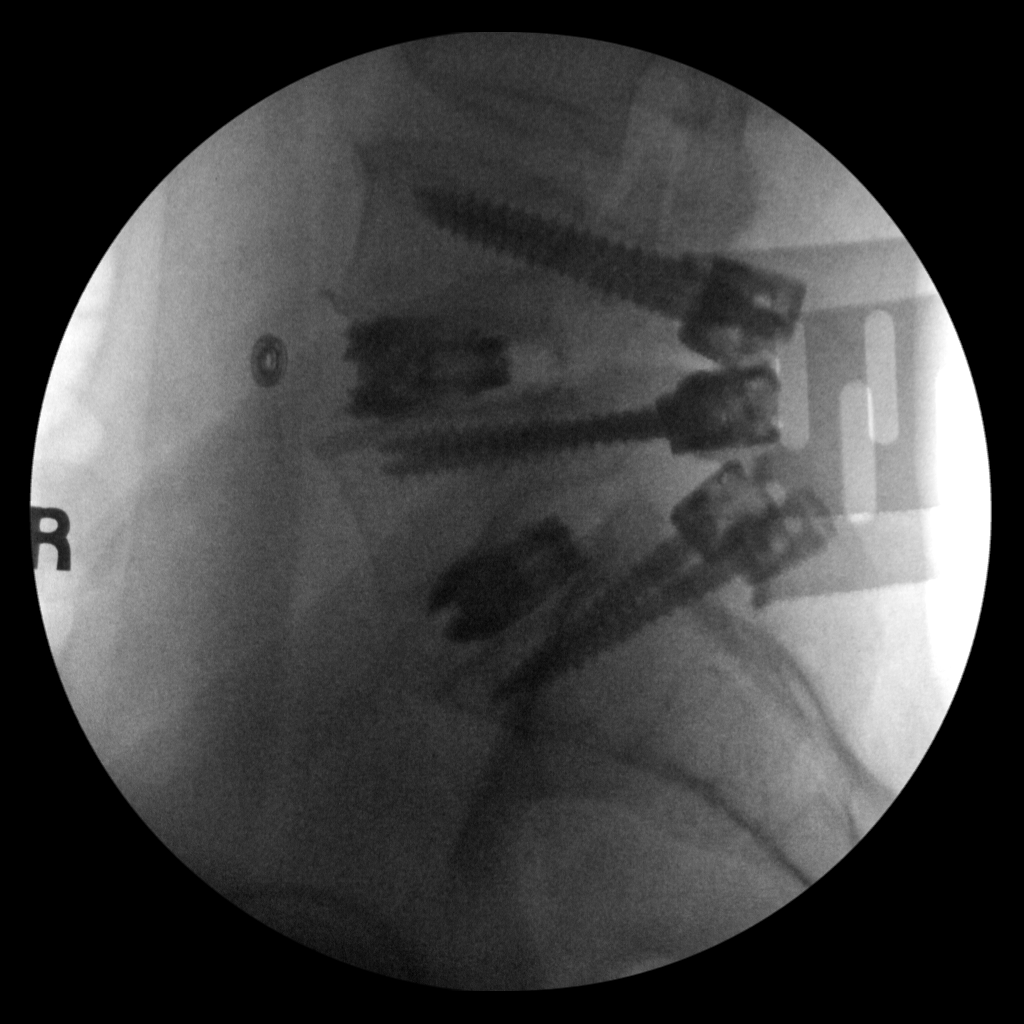
[im 2/2]
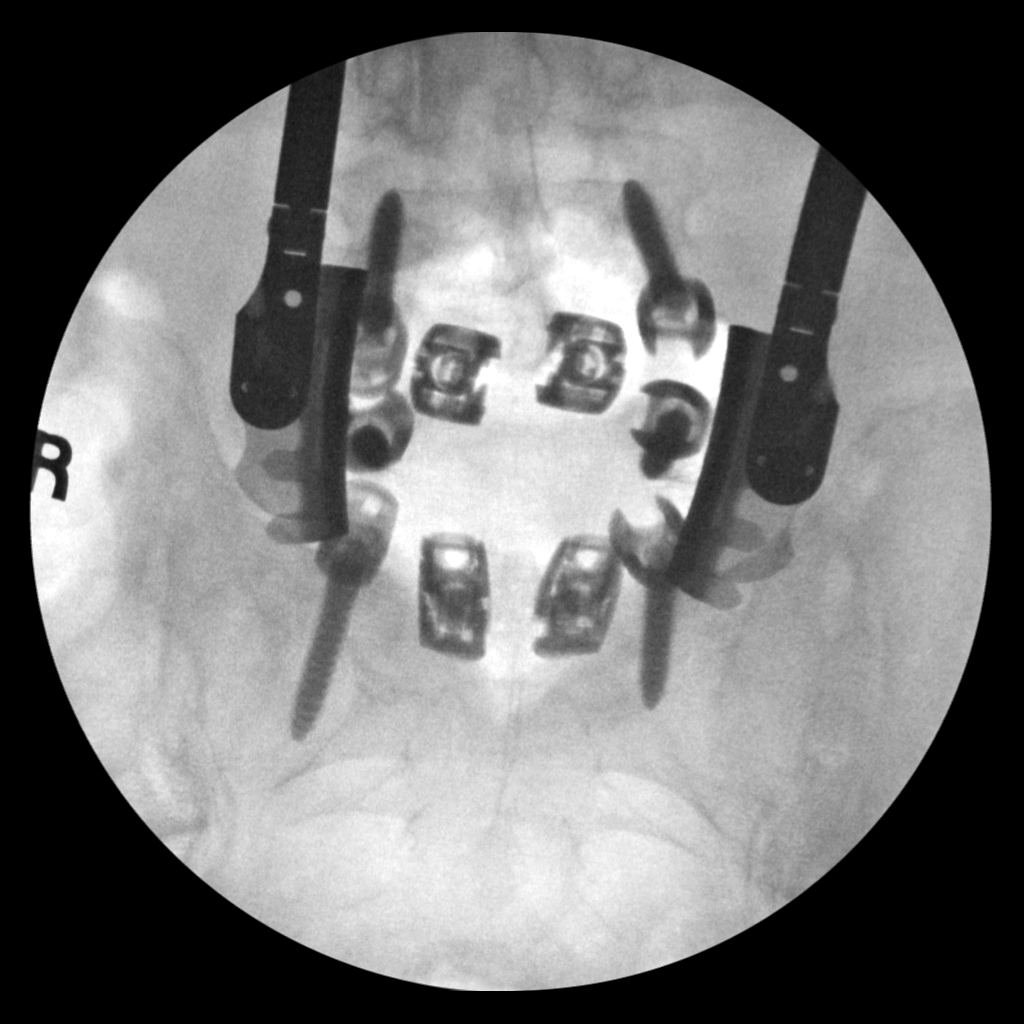

[2 of 2 positions shown; findings below may reference images not displayed]

FINDINGS: Frontal and lateral view intraoperative fluoroscopic images of the
lumbar spine are submitted, 2 images total. Spinal numbering will
remain consistent with that utilized on the prior lumbar spine MRI
of 11/20/2020. The lowest well-formed intervertebral disc space is
designated L5-S1. On the provided images, there are bilateral
pedicle screws at L4, L5 and S1. Vertical interconnecting rods were
not present at the time the images were taken. Interbody devices are
present at L4-L5 and L5-S1. Overlying retractors.
IMPRESSION: Two intraoperative fluoroscopic images of the lumbar spine from
L4-S1 fusion, as described.

## 2021-08-15 DIAGNOSIS — E291 Testicular hypofunction: Secondary | ICD-10-CM | POA: Diagnosis not present

## 2021-08-28 DIAGNOSIS — R2689 Other abnormalities of gait and mobility: Secondary | ICD-10-CM | POA: Diagnosis not present

## 2021-08-28 DIAGNOSIS — R413 Other amnesia: Secondary | ICD-10-CM | POA: Diagnosis not present

## 2021-08-28 DIAGNOSIS — G629 Polyneuropathy, unspecified: Secondary | ICD-10-CM | POA: Diagnosis not present

## 2021-08-28 DIAGNOSIS — M5126 Other intervertebral disc displacement, lumbar region: Secondary | ICD-10-CM | POA: Diagnosis not present

## 2021-08-29 DIAGNOSIS — E291 Testicular hypofunction: Secondary | ICD-10-CM | POA: Diagnosis not present

## 2021-09-12 DIAGNOSIS — E291 Testicular hypofunction: Secondary | ICD-10-CM | POA: Diagnosis not present

## 2022-01-22 IMAGING — MR MR CERVICAL SPINE W/O CM
4 of 5 series · 30 of 48 positions shown · non-contrast
Comparison: None.

CLINICAL DATA: Muscle spasms for 1 year.  Loss of balance.

EXAM:
MRI CERVICAL SPINE WITHOUT CONTRAST
TECHNIQUE: Multiplanar, multisequence MR imaging of the cervical spine was
performed. No intravenous contrast was administered.

[Series 2: T2 · sagittal · 3.0mm · 0.66mm/px · 8 of 15 slices shown (1 of 2)]
[im 1/15]
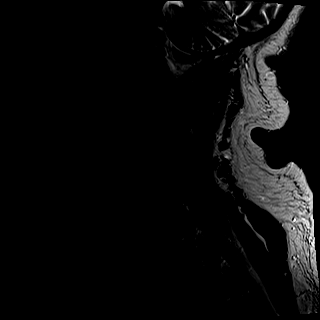
[im 3/15]
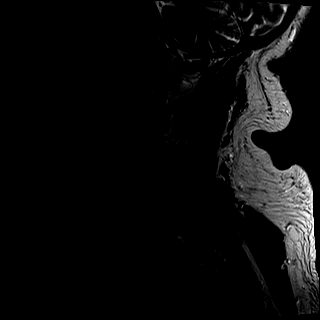
[im 5/15]
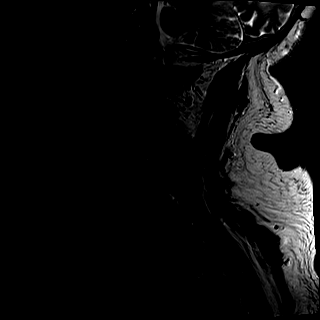
[im 7/15]
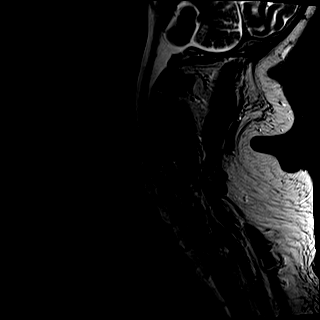
[im 9/15]
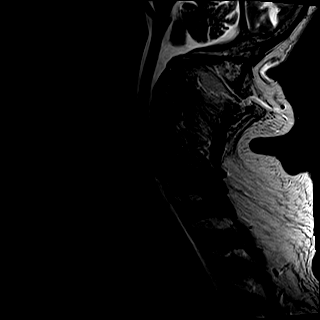
[im 11/15]
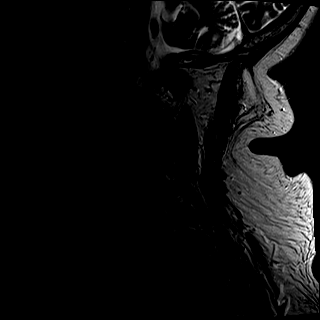
[im 13/15]
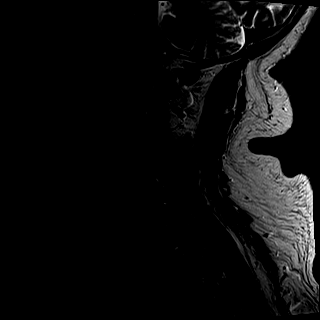
[im 15/15]
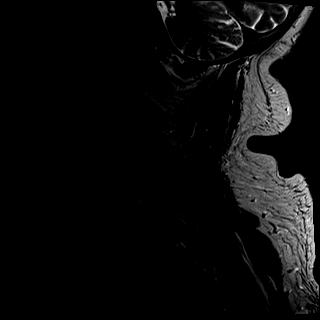

[Series 3: T1 · sagittal · 3.0mm · 0.41mm/px · 7 of 15 slices shown]
[im 1/15]
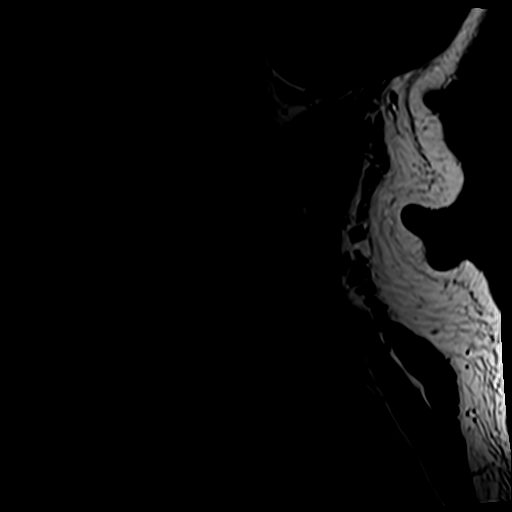
[im 3/15]
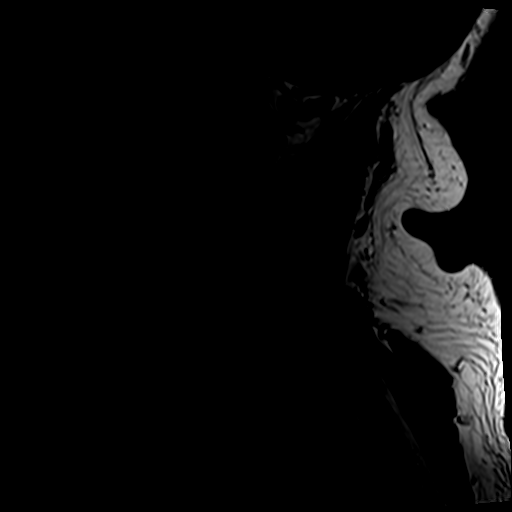
[im 5/15]
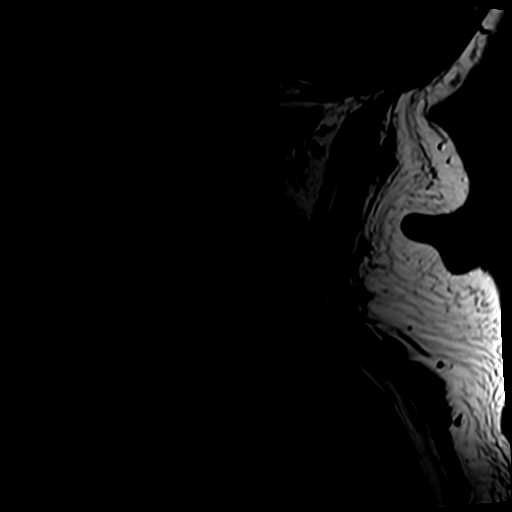
[im 8/15]
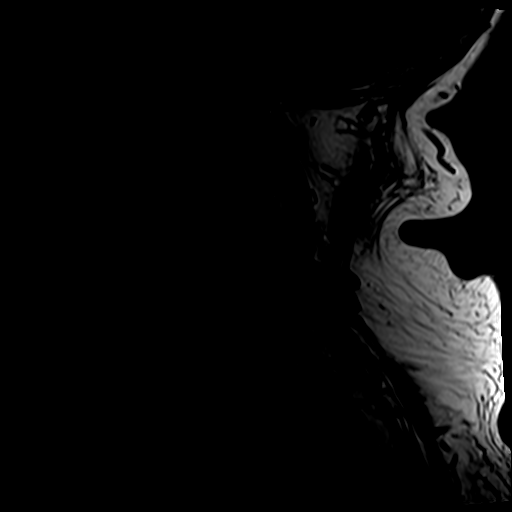
[im 10/15]
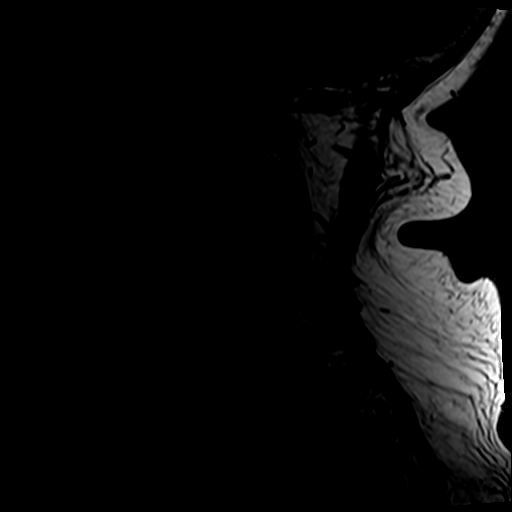
[im 12/15]
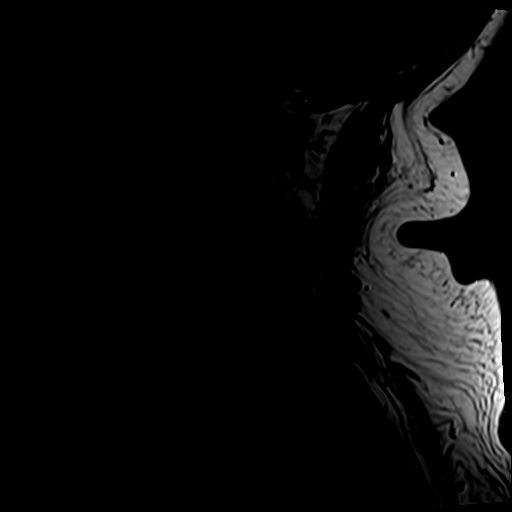
[im 15/15]
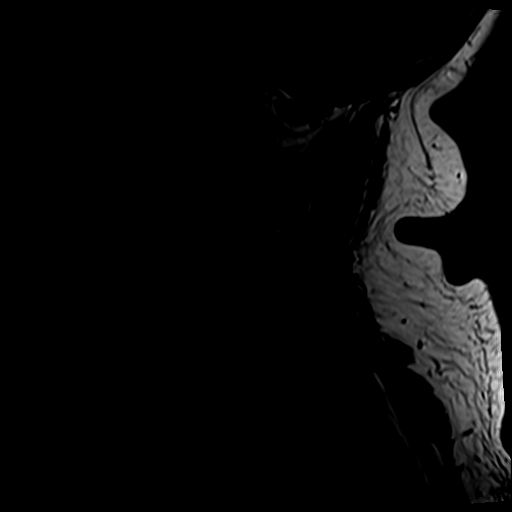

[Series 4: tir sag · sagittal · 3.0mm · 0.41mm/px · 6 of 15 slices shown]
[im 1/15]
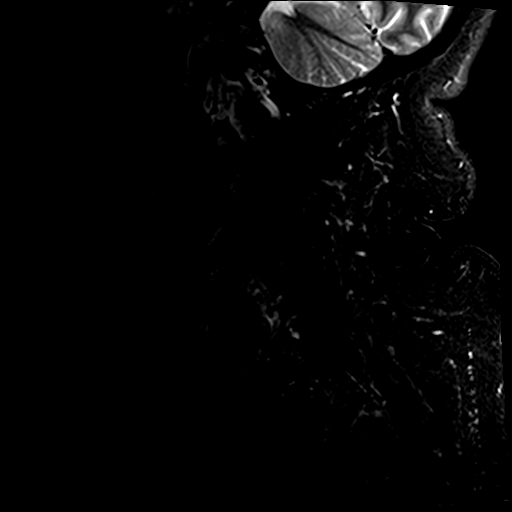
[im 3/15]
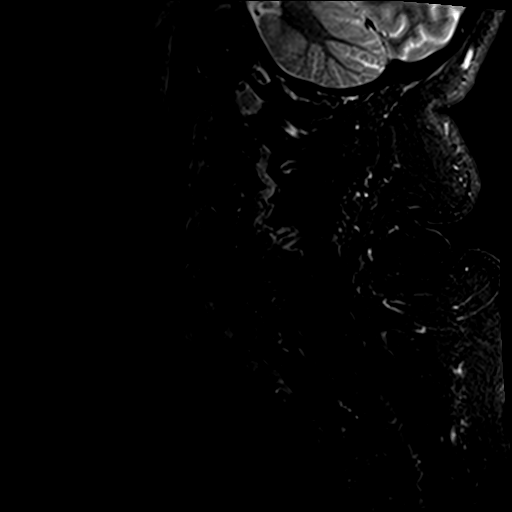
[im 5/15]
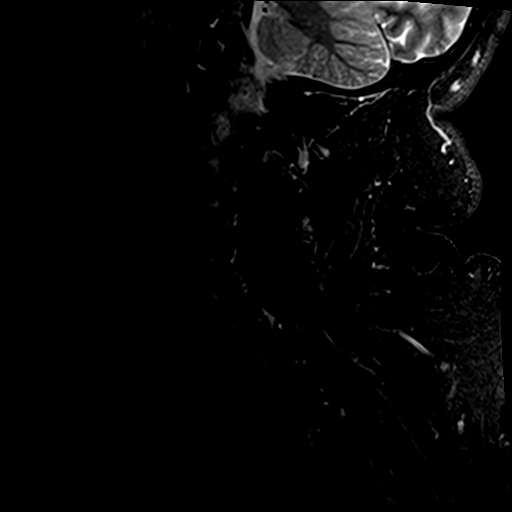
[im 8/15]
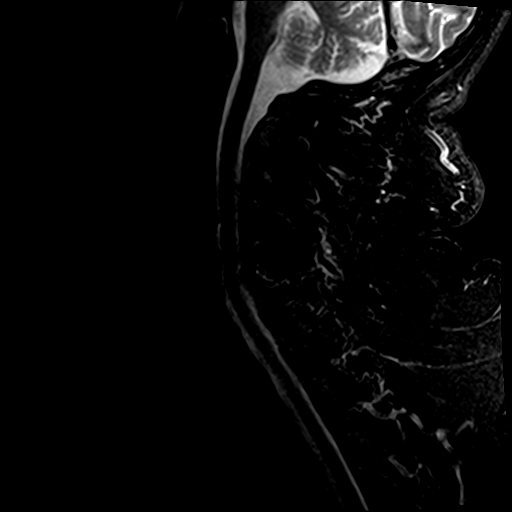
[im 10/15]
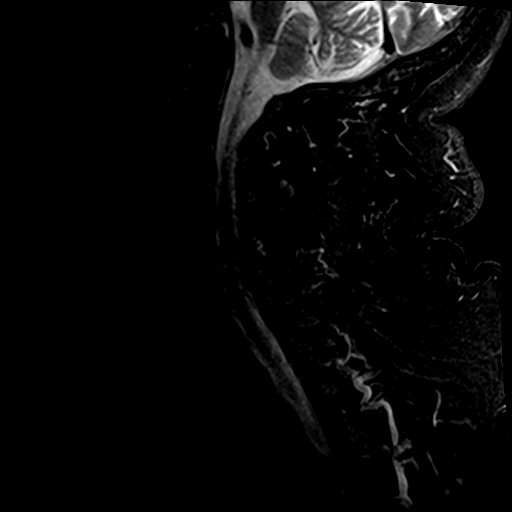
[im 12/15]
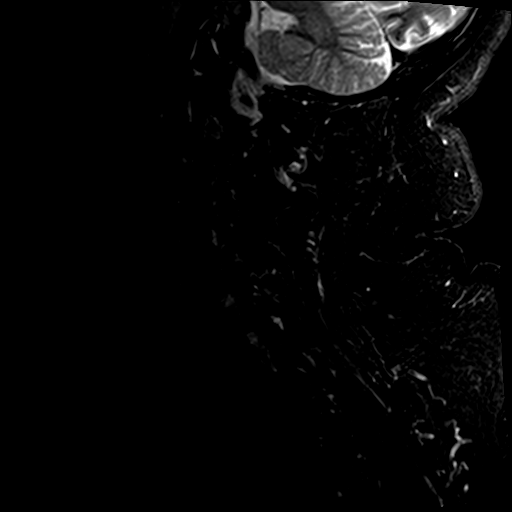

[Series 6: T2 · axial · 3.0mm · 0.70mm/px · z∈[+16,+116]mm · 9 of 28 slices shown (2 of 2)]
[im 1/28]
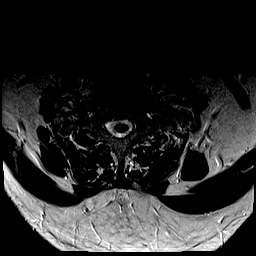
[im 5/28]
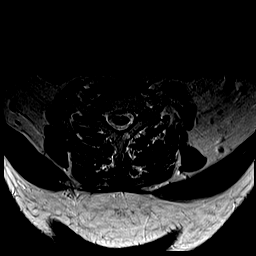
[im 10/28]
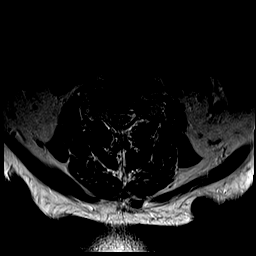
[im 12/28]
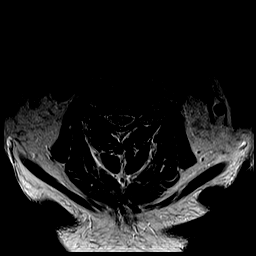
[im 14/28]
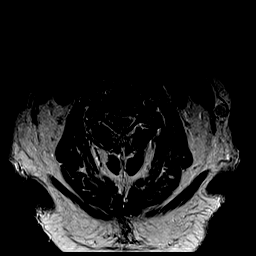
[im 16/28]
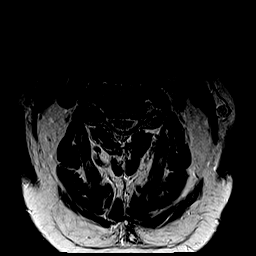
[im 19/28]
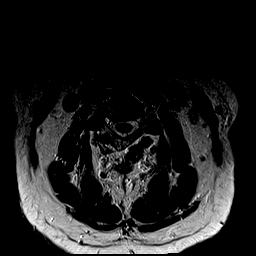
[im 23/28]
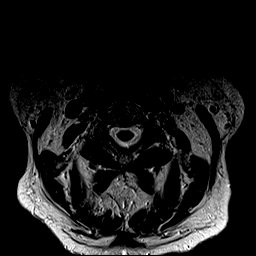
[im 28/28]
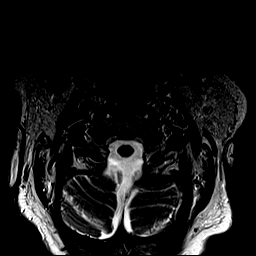

[30 of 48 positions shown; findings below may reference images not displayed]

FINDINGS: Alignment: Physiologic.

Vertebrae: No acute fracture, evidence of discitis, or bone lesion.

Cord: Normal signal and morphology.

Posterior Fossa, vertebral arteries, paraspinal tissues: Posterior
fossa demonstrates no focal abnormality. Vertebral artery flow voids
are maintained. Paraspinal soft tissues are unremarkable.

Disc levels:

Discs: Mild degenerative disease with disc height loss at C4-5 and
C5-6.

C2-3: No significant disc bulge. No neural foraminal stenosis. No
central canal stenosis.

C3-4: No disc bulge. Mild right foraminal stenosis. No left
foraminal stenosis. No central canal stenosis.

C4-5: Mild broad-based disc bulge. Bilateral uncovertebral
degenerative changes. Moderate right and severe left foraminal
stenosis. Mild spinal stenosis.

C5-6: Broad-based disc bulge flattening the ventral thecal sac.
Bilateral uncovertebral degenerative changes. Severe bilateral
foraminal stenosis. Moderate spinal stenosis.

C6-7: No significant disc bulge. Mild bilateral foraminal stenosis.
No central canal stenosis.

C7-T1: No significant disc bulge. Mild bilateral foraminal stenosis.
No central canal stenosis.
IMPRESSION: 1. Diffuse cervical spine spondylosis as described above.
2.  No acute osseous injury of the cervical spine.

## 2023-02-25 DIAGNOSIS — R972 Elevated prostate specific antigen [PSA]: Secondary | ICD-10-CM

## 2023-05-14 DIAGNOSIS — I2510 Atherosclerotic heart disease of native coronary artery without angina pectoris: Secondary | ICD-10-CM

## 2023-05-23 NOTE — Progress Notes (Deleted)
Cardiology Office Note:    Date:  05/23/2023   ID:  Rodney, Harper 07-05-1956, MRN [id number]  PCP:  Gordan Payment., MD  Cardiologist:  Norman Herrlich, MD   Referring MD: Gordan Payment., MD  ASSESSMENT:    No diagnosis found. PLAN:    In order of problems listed above:  ***  Next appointment   Medication Adjustments/Labs and Tests Ordered: Current medicines are reviewed at length with the patient today.  Concerns regarding medicines are outlined above.  No orders of the defined types were placed in this encounter.  No orders of the defined types were placed in this encounter.    No chief complaint on file. ***  History of Present Illness:    Rodney Harper is a 67 y.o. male who is being seen today for the evaluation of CAD at the request of Gordan Payment., MD.  Recent admission [facility] discharge summary admission 05/06/2023 ST elevation MI PCI and stent right coronary artery normal ejection fraction 55 to 60% hypertension hyperlipidemia placed on a low intensity statin pravastatin diabetes liver disease and cirrhosis and chronic pain syndrome.  Coronary angiogram per report relates that he had PCI and stent proximal right coronary distal right coronary and a right sided posterior branch with PCI and no stent.  Peak troponin was 1530.  05/05/2022: Coronary Angiography Findings:  --LM: Minimal luminal irregularities, very short  --LAD: Prox 50%, distal long 60%, D1 90%  --LCx: OM1 60%, OM2 60%  --RCA: Prox 99%, distal 80%, PRL 70% at bifurcation (relatively small  vessel)  --AV crossed: LVEDP: 20 mmHg   Echocardiogram showed no segmental abnormality. SUMMARY  The left ventricular size is normal.  Mild left ventricular hypertrophy  Left ventricular systolic function is normal.  LV ejection fraction = 55-60%.  Left ventricular filling pattern is prolonged relaxation.  Unable to fully assess LV regional wall motion  The right  ventricle is normal in size and function.  The left atrium is mildly dilated.  There is no significant valvular stenosis or regurgitation.  The aortic sinus is normal size.  IVC size was mildly dilated.  There is no pericardial effusion.  There is no comparison study available.  Past Medical History:  Diagnosis Date   Alcohol abuse    Anemia    Anxiety    Benign essential HTN    Benign prostatic hyperplasia without lower urinary tract symptoms    Chronic edema    Chronic liver disease and cirrhosis (HCC)    COVID-19    x2   History of kidney stones    Hypogonadism in male    Lumbar disc herniation with radiculopathy    Mild episode of recurrent major depressive disorder (HCC)    Mixed hyperlipidemia    Pain disorder    Pneumonia    Primary insomnia    Seizures (HCC)    Type 2 diabetes mellitus with diabetic polyneuropathy, with long-term current use of insulin (HCC)    Vitamin B12 deficiency    Vitamin D deficiency     Past Surgical History:  Procedure Laterality Date   ANKLE FRACTURE SURGERY     Left   APPENDECTOMY     BACK SURGERY     COLONOSCOPY  05/07/2012   Mild sigmoid diverticulosis. Small internal hemorrhoids. OTherwise normal colonoscopy.    ESOPHAGOGASTRODUODENOSCOPY  01/26/2020   [facility]    Current Medications: No outpatient medications have been marked as taking for  the 05/24/23 encounter (Appointment) with Baldo Daub, MD.     Allergies:   Carbamazepine and Clonidine   Social History   Socioeconomic History   Marital status: Married    Spouse name: Not on file   Number of children: Not on file   Years of education: Not on file   Highest education level: Not on file  Occupational History   Not on file  Tobacco Use   Smoking status: Former   Smokeless tobacco: Never  Vaping Use   Vaping status: Never Used  Substance and Sexual Activity   Alcohol use: Not Currently   Drug use: Not Currently   Sexual activity: Not on  file  Other Topics Concern   Not on file  Social History Narrative   Not on file   Social Determinants of Health   Financial Resource Strain: Not on file  Food Insecurity: Low Risk  (05/06/2023)   Received from [facility]   Food vital sign    Within the past 12 months, you worried that your food would run out before you got money to buy more: Never true    Within the past 12 months, the food you bought just didn't last and you didn't have money to get more. : Never true  Transportation Needs: Not on file (05/06/2023)  Physical Activity: Not on file  Stress: Not on file  Social Connections: Unknown (12/14/2022)   Received from [facility]   Social Network    Social Network: Not on file     Family History: The patient's ***family history includes Heart attack in his father; Heart disease in his father; Hypertension in his father; Throat cancer in his mother. There is no history of Colon cancer or Rectal cancer.  ROS:   ROS Please see the history of present illness.    *** All other systems reviewed and are negative.  EKGs/Labs/Other Studies Reviewed:    The following studies were reviewed today: ***      EKG:  EKG is *** ordered today.  The ekg ordered today is personally reviewed and demonstrates ***  Recent Labs: No results found for requested labs within last 365 days.  Recent Lipid Panel No results found for: "CHOL", "TRIG", "HDL", "CHOLHDL", "VLDL", "LDLCALC", "LDLDIRECT"  Physical Exam:    VS:  There were no vitals taken for this visit.    Wt Readings from Last 3 Encounters:  12/13/20 205 lb (93 kg)  12/09/20 222 lb 8 oz (100.9 kg)  09/16/20 226 lb 2 oz (102.6 kg)     GEN: *** Well nourished, well developed in no acute distress HEENT: Normal NECK: No JVD; No carotid bruits LYMPHATICS: No lymphadenopathy CARDIAC: ***RRR, no murmurs, rubs, gallops RESPIRATORY:  Clear to auscultation without rales, wheezing or rhonchi  ABDOMEN: Soft, non-tender,  non-distended MUSCULOSKELETAL:  No edema; No deformity  SKIN: Warm and dry NEUROLOGIC:  Alert and oriented x 3 PSYCHIATRIC:  Normal affect     Signed, Norman Herrlich, MD  05/23/2023 7:54 AM    [facility]

## 2023-05-24 ENCOUNTER — Ambulatory Visit: Payer: PPO | Attending: Cardiology | Admitting: Cardiology
